# Patient Record
Sex: Female | Born: 1935 | Race: White | Hispanic: No | Marital: Married | Smoking: Former smoker
Health system: Southern US, Community
[De-identification: ages and names within clinical notes are randomized; demographics above are authoritative.]

## PROBLEM LIST (undated history)

## (undated) DIAGNOSIS — C719 Malignant neoplasm of brain, unspecified: Secondary | ICD-10-CM

## (undated) DIAGNOSIS — Z923 Personal history of irradiation: Secondary | ICD-10-CM

## (undated) DIAGNOSIS — N189 Chronic kidney disease, unspecified: Secondary | ICD-10-CM

## (undated) DIAGNOSIS — C569 Malignant neoplasm of unspecified ovary: Secondary | ICD-10-CM

## (undated) DIAGNOSIS — I10 Essential (primary) hypertension: Secondary | ICD-10-CM

## (undated) DIAGNOSIS — D649 Anemia, unspecified: Secondary | ICD-10-CM

## (undated) DIAGNOSIS — D249 Benign neoplasm of unspecified breast: Secondary | ICD-10-CM

## (undated) DIAGNOSIS — C50919 Malignant neoplasm of unspecified site of unspecified female breast: Secondary | ICD-10-CM

## (undated) DIAGNOSIS — N952 Postmenopausal atrophic vaginitis: Secondary | ICD-10-CM

## (undated) DIAGNOSIS — R5383 Other fatigue: Secondary | ICD-10-CM

## (undated) HISTORY — DX: Malignant neoplasm of unspecified ovary: C56.9

## (undated) HISTORY — DX: Benign neoplasm of unspecified breast: D24.9

## (undated) HISTORY — DX: Essential (primary) hypertension: I10

## (undated) HISTORY — DX: Other fatigue: R53.83

## (undated) HISTORY — DX: Anemia, unspecified: D64.9

## (undated) HISTORY — PX: EYE SURGERY: SHX253

## (undated) HISTORY — DX: Malignant neoplasm of brain, unspecified: C71.9

## (undated) HISTORY — PX: APPENDECTOMY: SHX54

## (undated) HISTORY — DX: Chronic kidney disease, unspecified: N18.9

## (undated) HISTORY — DX: Postmenopausal atrophic vaginitis: N95.2

## (undated) HISTORY — DX: Malignant neoplasm of unspecified site of unspecified female breast: C50.919

---

## 1989-02-17 DIAGNOSIS — D249 Benign neoplasm of unspecified breast: Secondary | ICD-10-CM

## 1989-02-17 HISTORY — DX: Benign neoplasm of unspecified breast: D24.9

## 1998-05-08 ENCOUNTER — Other Ambulatory Visit: Admission: RE | Admit: 1998-05-08 | Discharge: 1998-05-08 | Payer: Self-pay | Admitting: Obstetrics and Gynecology

## 1999-06-29 ENCOUNTER — Other Ambulatory Visit: Admission: RE | Admit: 1999-06-29 | Discharge: 1999-06-29 | Payer: Self-pay | Admitting: Obstetrics and Gynecology

## 2000-07-11 ENCOUNTER — Other Ambulatory Visit: Admission: RE | Admit: 2000-07-11 | Discharge: 2000-07-11 | Payer: Self-pay | Admitting: Obstetrics and Gynecology

## 2001-07-11 ENCOUNTER — Other Ambulatory Visit: Admission: RE | Admit: 2001-07-11 | Discharge: 2001-07-11 | Payer: Self-pay | Admitting: Obstetrics and Gynecology

## 2002-07-10 ENCOUNTER — Encounter: Payer: Self-pay | Admitting: Gastroenterology

## 2002-09-20 ENCOUNTER — Ambulatory Visit (HOSPITAL_COMMUNITY): Admission: RE | Admit: 2002-09-20 | Discharge: 2002-09-20 | Payer: Self-pay | Admitting: Obstetrics and Gynecology

## 2002-09-20 ENCOUNTER — Encounter: Payer: Self-pay | Admitting: Obstetrics and Gynecology

## 2002-10-03 ENCOUNTER — Ambulatory Visit: Admission: RE | Admit: 2002-10-03 | Discharge: 2002-10-03 | Payer: Self-pay | Admitting: Gynecology

## 2002-10-04 DIAGNOSIS — C569 Malignant neoplasm of unspecified ovary: Secondary | ICD-10-CM

## 2002-10-04 HISTORY — PX: TOTAL ABDOMINAL HYSTERECTOMY W/ BILATERAL SALPINGOOPHORECTOMY: SHX83

## 2002-10-04 HISTORY — DX: Malignant neoplasm of unspecified ovary: C56.9

## 2002-10-05 ENCOUNTER — Encounter: Payer: Self-pay | Admitting: Gynecology

## 2002-10-09 ENCOUNTER — Encounter (INDEPENDENT_AMBULATORY_CARE_PROVIDER_SITE_OTHER): Payer: Self-pay | Admitting: Specialist

## 2002-10-09 ENCOUNTER — Inpatient Hospital Stay (HOSPITAL_COMMUNITY): Admission: RE | Admit: 2002-10-09 | Discharge: 2002-10-12 | Payer: Self-pay | Admitting: Gynecology

## 2002-10-17 ENCOUNTER — Ambulatory Visit: Admission: RE | Admit: 2002-10-17 | Discharge: 2002-10-17 | Payer: Self-pay | Admitting: Gynecology

## 2002-11-28 ENCOUNTER — Ambulatory Visit: Admission: RE | Admit: 2002-11-28 | Discharge: 2002-11-28 | Payer: Self-pay | Admitting: Gynecology

## 2003-04-24 ENCOUNTER — Ambulatory Visit: Admission: RE | Admit: 2003-04-24 | Discharge: 2003-04-24 | Payer: Self-pay | Admitting: Gynecology

## 2003-10-16 ENCOUNTER — Ambulatory Visit: Admission: RE | Admit: 2003-10-16 | Discharge: 2003-10-16 | Payer: Self-pay | Admitting: Gynecology

## 2004-01-30 ENCOUNTER — Ambulatory Visit (HOSPITAL_COMMUNITY): Admission: RE | Admit: 2004-01-30 | Discharge: 2004-01-30 | Payer: Self-pay | Admitting: Internal Medicine

## 2004-04-08 ENCOUNTER — Ambulatory Visit (HOSPITAL_COMMUNITY): Admission: RE | Admit: 2004-04-08 | Discharge: 2004-04-08 | Payer: Self-pay | Admitting: Vascular Surgery

## 2004-04-14 ENCOUNTER — Ambulatory Visit: Admission: RE | Admit: 2004-04-14 | Discharge: 2004-04-14 | Payer: Self-pay | Admitting: Gynecology

## 2004-08-06 ENCOUNTER — Ambulatory Visit: Payer: Self-pay | Admitting: Hematology & Oncology

## 2004-08-18 ENCOUNTER — Encounter (HOSPITAL_COMMUNITY): Admission: RE | Admit: 2004-08-18 | Discharge: 2004-08-31 | Payer: Self-pay | Admitting: Nephrology

## 2004-10-09 ENCOUNTER — Ambulatory Visit: Payer: Self-pay | Admitting: Hematology & Oncology

## 2004-10-20 ENCOUNTER — Ambulatory Visit: Admission: RE | Admit: 2004-10-20 | Discharge: 2004-10-20 | Payer: Self-pay | Admitting: Gynecology

## 2004-12-10 ENCOUNTER — Ambulatory Visit: Payer: Self-pay | Admitting: Hematology & Oncology

## 2005-02-10 ENCOUNTER — Ambulatory Visit: Payer: Self-pay | Admitting: Hematology & Oncology

## 2005-04-15 ENCOUNTER — Ambulatory Visit: Payer: Self-pay | Admitting: Hematology & Oncology

## 2005-06-16 ENCOUNTER — Ambulatory Visit: Payer: Self-pay | Admitting: Hematology & Oncology

## 2005-08-16 ENCOUNTER — Ambulatory Visit (HOSPITAL_COMMUNITY): Admission: RE | Admit: 2005-08-16 | Discharge: 2005-08-16 | Payer: Self-pay | Admitting: Nephrology

## 2005-08-17 ENCOUNTER — Ambulatory Visit: Payer: Self-pay | Admitting: Hematology & Oncology

## 2005-10-13 ENCOUNTER — Ambulatory Visit: Payer: Self-pay | Admitting: Hematology & Oncology

## 2005-11-02 ENCOUNTER — Ambulatory Visit: Admission: RE | Admit: 2005-11-02 | Discharge: 2005-11-02 | Payer: Self-pay | Admitting: Gynecology

## 2005-12-08 ENCOUNTER — Ambulatory Visit: Payer: Self-pay | Admitting: Hematology & Oncology

## 2006-01-06 LAB — CBC WITH DIFFERENTIAL/PLATELET
BASO%: 0.3 % (ref 0.0–2.0)
Basophils Absolute: 0 10*3/uL (ref 0.0–0.1)
EOS%: 4.2 % (ref 0.0–7.0)
HCT: 37.7 % (ref 34.8–46.6)
LYMPH%: 25 % (ref 14.0–48.0)
MCH: 32.4 pg (ref 26.0–34.0)
MCHC: 34 g/dL (ref 32.0–36.0)
NEUT%: 66.3 % (ref 39.6–76.8)
Platelets: 246 10*3/uL (ref 145–400)

## 2006-01-06 LAB — COMPREHENSIVE METABOLIC PANEL
ALT: 26 U/L (ref 0–40)
AST: 26 U/L (ref 0–37)
Albumin: 3.7 g/dL (ref 3.5–5.2)
Alkaline Phosphatase: 92 U/L (ref 39–117)
Potassium: 4.3 mEq/L (ref 3.5–5.3)
Sodium: 138 mEq/L (ref 135–145)
Total Protein: 6.8 g/dL (ref 6.0–8.3)

## 2006-02-01 ENCOUNTER — Ambulatory Visit: Payer: Self-pay | Admitting: Hematology & Oncology

## 2006-02-03 LAB — COMPREHENSIVE METABOLIC PANEL
ALT: 66 U/L — ABNORMAL HIGH (ref 0–40)
Albumin: 4 g/dL (ref 3.5–5.2)
CO2: 25 mEq/L (ref 19–32)
Calcium: 9.7 mg/dL (ref 8.4–10.5)
Chloride: 102 mEq/L (ref 96–112)
Creatinine, Ser: 2.9 mg/dL — ABNORMAL HIGH (ref 0.4–1.2)
Potassium: 4.1 mEq/L (ref 3.5–5.3)

## 2006-02-03 LAB — CBC WITH DIFFERENTIAL/PLATELET
Basophils Absolute: 0 10*3/uL (ref 0.0–0.1)
Eosinophils Absolute: 0.3 10*3/uL (ref 0.0–0.5)
HCT: 33.2 % — ABNORMAL LOW (ref 34.8–46.6)
HGB: 11.4 g/dL — ABNORMAL LOW (ref 11.6–15.9)
LYMPH%: 18.3 % (ref 14.0–48.0)
MCV: 94.7 fL (ref 81.0–101.0)
MONO%: 5 % (ref 0.0–13.0)
NEUT#: 3.9 10*3/uL (ref 1.5–6.5)
NEUT%: 71.7 % (ref 39.6–76.8)
Platelets: 230 10*3/uL (ref 145–400)
RBC: 3.51 10*6/uL — ABNORMAL LOW (ref 3.70–5.32)

## 2006-02-03 LAB — MAGNESIUM: Magnesium: 1.6 mg/dL (ref 1.5–2.5)

## 2006-02-04 LAB — PTH, INTACT AND CALCIUM: Calcium, Total (PTH): 9.6 mg/dL (ref 8.4–10.5)

## 2006-03-03 LAB — CBC & DIFF AND RETIC
BASO%: 0.3 % (ref 0.0–2.0)
Basophils Absolute: 0 10*3/uL (ref 0.0–0.1)
EOS%: 3.6 % (ref 0.0–7.0)
HCT: 37.7 % (ref 34.8–46.6)
LYMPH%: 23.6 % (ref 14.0–48.0)
MCH: 32.6 pg (ref 26.0–34.0)
MCHC: 34.3 g/dL (ref 32.0–36.0)
MCV: 95 fL (ref 81.0–101.0)
MONO%: 3.5 % (ref 0.0–13.0)
NEUT%: 69 % (ref 39.6–76.8)
Platelets: 253 10*3/uL (ref 145–400)
lymph#: 1.3 10*3/uL (ref 0.9–3.3)

## 2006-03-03 LAB — FERRITIN: Ferritin: 289 ng/mL (ref 10–291)

## 2006-03-28 ENCOUNTER — Ambulatory Visit: Payer: Self-pay | Admitting: Hematology & Oncology

## 2006-03-31 LAB — CBC WITH DIFFERENTIAL/PLATELET
BASO%: 0.3 % (ref 0.0–2.0)
EOS%: 3.6 % (ref 0.0–7.0)
LYMPH%: 18 % (ref 14.0–48.0)
MCHC: 33.9 g/dL (ref 32.0–36.0)
MONO#: 0.3 10*3/uL (ref 0.1–0.9)
RBC: 3.69 10*6/uL — ABNORMAL LOW (ref 3.70–5.32)
WBC: 6.2 10*3/uL (ref 3.9–10.0)
lymph#: 1.1 10*3/uL (ref 0.9–3.3)

## 2006-03-31 LAB — COMPREHENSIVE METABOLIC PANEL
ALT: 20 U/L (ref 0–40)
AST: 22 U/L (ref 0–37)
Alkaline Phosphatase: 80 U/L (ref 39–117)
Calcium: 9.8 mg/dL (ref 8.4–10.5)
Chloride: 99 mEq/L (ref 96–112)
Creatinine, Ser: 2.88 mg/dL — ABNORMAL HIGH (ref 0.40–1.20)

## 2006-03-31 LAB — FERRITIN: Ferritin: 380 ng/mL — ABNORMAL HIGH (ref 10–291)

## 2006-04-13 LAB — CA 125: CA 125: 21.9 U/mL (ref 0.0–30.2)

## 2006-04-28 LAB — CBC WITH DIFFERENTIAL/PLATELET
BASO%: 0.3 % (ref 0.0–2.0)
Basophils Absolute: 0 10*3/uL (ref 0.0–0.1)
EOS%: 2.8 % (ref 0.0–7.0)
HCT: 37.9 % (ref 34.8–46.6)
HGB: 12.7 g/dL (ref 11.6–15.9)
LYMPH%: 21.3 % (ref 14.0–48.0)
MCH: 32.2 pg (ref 26.0–34.0)
MCHC: 33.6 g/dL (ref 32.0–36.0)
MCV: 96 fL (ref 81.0–101.0)
MONO%: 6.9 % (ref 0.0–13.0)
NEUT%: 68.7 % (ref 39.6–76.8)
lymph#: 1.1 10*3/uL (ref 0.9–3.3)

## 2006-05-20 ENCOUNTER — Ambulatory Visit: Payer: Self-pay | Admitting: Hematology & Oncology

## 2006-05-26 LAB — CBC WITH DIFFERENTIAL/PLATELET
Basophils Absolute: 0 10*3/uL (ref 0.0–0.1)
Eosinophils Absolute: 0.2 10*3/uL (ref 0.0–0.5)
HCT: 34.9 % (ref 34.8–46.6)
HGB: 12 g/dL (ref 11.6–15.9)
LYMPH%: 21.5 % (ref 14.0–48.0)
MCV: 94.8 fL (ref 81.0–101.0)
MONO#: 0.5 10*3/uL (ref 0.1–0.9)
MONO%: 7.7 % (ref 0.0–13.0)
NEUT#: 4 10*3/uL (ref 1.5–6.5)
Platelets: 211 10*3/uL (ref 145–400)

## 2006-05-27 LAB — COMPREHENSIVE METABOLIC PANEL
AST: 29 U/L (ref 0–37)
BUN: 71 mg/dL — ABNORMAL HIGH (ref 6–23)
Calcium: 9.8 mg/dL (ref 8.4–10.5)
Chloride: 102 mEq/L (ref 96–112)
Creatinine, Ser: 2.69 mg/dL — ABNORMAL HIGH (ref 0.40–1.20)

## 2006-05-27 LAB — IRON AND TIBC
%SAT: 32 % (ref 20–55)
Iron: 90 ug/dL (ref 42–145)

## 2006-05-27 LAB — PTH, INTACT AND CALCIUM: Calcium, Total (PTH): 9.8 mg/dL (ref 8.4–10.5)

## 2006-06-23 LAB — CBC WITH DIFFERENTIAL/PLATELET
BASO%: 0.3 % (ref 0.0–2.0)
Eosinophils Absolute: 0.3 10*3/uL (ref 0.0–0.5)
HCT: 38.9 % (ref 34.8–46.6)
LYMPH%: 21.9 % (ref 14.0–48.0)
MCHC: 33.9 g/dL (ref 32.0–36.0)
MCV: 95.9 fL (ref 81.0–101.0)
MONO%: 7.2 % (ref 0.0–13.0)
NEUT%: 65.2 % (ref 39.6–76.8)
Platelets: 274 10*3/uL (ref 145–400)
RBC: 4.06 10*6/uL (ref 3.70–5.32)

## 2006-07-19 ENCOUNTER — Ambulatory Visit: Payer: Self-pay | Admitting: Hematology & Oncology

## 2006-08-18 LAB — CBC WITH DIFFERENTIAL/PLATELET
Basophils Absolute: 0 10*3/uL (ref 0.0–0.1)
EOS%: 5.3 % (ref 0.0–7.0)
HCT: 37.1 % (ref 34.8–46.6)
HGB: 12.5 g/dL (ref 11.6–15.9)
MCH: 32.2 pg (ref 26.0–34.0)
MCV: 95.3 fL (ref 81.0–101.0)
MONO%: 5.1 % (ref 0.0–13.0)
NEUT%: 71.3 % (ref 39.6–76.8)
Platelets: 254 10*3/uL (ref 145–400)

## 2006-09-08 ENCOUNTER — Ambulatory Visit: Payer: Self-pay | Admitting: Hematology & Oncology

## 2006-09-12 LAB — CBC WITH DIFFERENTIAL/PLATELET
BASO%: 0.3 % (ref 0.0–2.0)
Basophils Absolute: 0 10*3/uL (ref 0.0–0.1)
EOS%: 4.3 % (ref 0.0–7.0)
HCT: 34.4 % — ABNORMAL LOW (ref 34.8–46.6)
HGB: 11.7 g/dL (ref 11.6–15.9)
MCH: 32 pg (ref 26.0–34.0)
MONO#: 0.3 10*3/uL (ref 0.1–0.9)
RDW: 15.8 % — ABNORMAL HIGH (ref 11.3–14.5)
WBC: 5.1 10*3/uL (ref 3.9–10.0)
lymph#: 0.7 10*3/uL — ABNORMAL LOW (ref 0.9–3.3)

## 2006-09-13 LAB — RENAL FUNCTION PANEL
BUN: 95 mg/dL — ABNORMAL HIGH (ref 6–23)
CO2: 23 mEq/L (ref 19–32)
Chloride: 103 mEq/L (ref 96–112)
Phosphorus: 4.3 mg/dL (ref 2.3–4.6)
Potassium: 4 mEq/L (ref 3.5–5.3)

## 2006-09-13 LAB — PTH, INTACT AND CALCIUM: PTH: 96.5 pg/mL — ABNORMAL HIGH (ref 14.0–72.0)

## 2006-10-13 LAB — CBC WITH DIFFERENTIAL/PLATELET
BASO%: 0.1 % (ref 0.0–2.0)
EOS%: 3.9 % (ref 0.0–7.0)
HCT: 38 % (ref 34.8–46.6)
MCH: 32 pg (ref 26.0–34.0)
MCHC: 33.7 g/dL (ref 32.0–36.0)
MCV: 94.9 fL (ref 81.0–101.0)
MONO%: 5.9 % (ref 0.0–13.0)
NEUT%: 73.8 % (ref 39.6–76.8)
RDW: 15.9 % — ABNORMAL HIGH (ref 11.3–14.5)
lymph#: 0.8 10*3/uL — ABNORMAL LOW (ref 0.9–3.3)

## 2006-10-19 ENCOUNTER — Ambulatory Visit: Admission: RE | Admit: 2006-10-19 | Discharge: 2006-10-19 | Payer: Self-pay | Admitting: Gynecology

## 2006-11-08 ENCOUNTER — Ambulatory Visit: Payer: Self-pay | Admitting: Hematology & Oncology

## 2006-11-10 LAB — CBC WITH DIFFERENTIAL/PLATELET
Basophils Absolute: 0 10*3/uL (ref 0.0–0.1)
Eosinophils Absolute: 0.3 10*3/uL (ref 0.0–0.5)
HCT: 33.9 % — ABNORMAL LOW (ref 34.8–46.6)
HGB: 11.9 g/dL (ref 11.6–15.9)
MCV: 93 fL (ref 81.0–101.0)
MONO%: 5.8 % (ref 0.0–13.0)
NEUT#: 4.4 10*3/uL (ref 1.5–6.5)
NEUT%: 75.4 % (ref 39.6–76.8)
Platelets: 220 10*3/uL (ref 145–400)
RDW: 15.9 % — ABNORMAL HIGH (ref 11.3–14.5)

## 2006-12-08 LAB — CBC WITH DIFFERENTIAL/PLATELET
Basophils Absolute: 0 10*3/uL (ref 0.0–0.1)
Eosinophils Absolute: 0.2 10*3/uL (ref 0.0–0.5)
LYMPH%: 17.2 % (ref 14.0–48.0)
MCH: 32.5 pg (ref 26.0–34.0)
MCV: 94.4 fL (ref 81.0–101.0)
MONO%: 5.5 % (ref 0.0–13.0)
NEUT#: 3.5 10*3/uL (ref 1.5–6.5)
Platelets: 221 10*3/uL (ref 145–400)
RBC: 4.03 10*6/uL (ref 3.70–5.32)

## 2007-01-02 ENCOUNTER — Ambulatory Visit: Payer: Self-pay | Admitting: Hematology & Oncology

## 2007-01-05 LAB — CBC WITH DIFFERENTIAL/PLATELET
BASO%: 0.1 % (ref 0.0–2.0)
LYMPH%: 16.9 % (ref 14.0–48.0)
MCHC: 35.4 g/dL (ref 32.0–36.0)
MCV: 93.4 fL (ref 81.0–101.0)
MONO%: 6.9 % (ref 0.0–13.0)
Platelets: 219 10*3/uL (ref 145–400)
RBC: 3.65 10*6/uL — ABNORMAL LOW (ref 3.70–5.32)
WBC: 5.6 10*3/uL (ref 3.9–10.0)

## 2007-02-13 LAB — CBC WITH DIFFERENTIAL/PLATELET
BASO%: 0.2 % (ref 0.0–2.0)
LYMPH%: 18.1 % (ref 14.0–48.0)
MCHC: 34.6 g/dL (ref 32.0–36.0)
MONO#: 0.4 10*3/uL (ref 0.1–0.9)
RBC: 3.81 10*6/uL (ref 3.70–5.32)
WBC: 6.3 10*3/uL (ref 3.9–10.0)
lymph#: 1.1 10*3/uL (ref 0.9–3.3)

## 2007-02-28 ENCOUNTER — Ambulatory Visit: Payer: Self-pay | Admitting: Hematology & Oncology

## 2007-03-02 LAB — CBC WITH DIFFERENTIAL/PLATELET
BASO%: 0.3 % (ref 0.0–2.0)
Basophils Absolute: 0 10*3/uL (ref 0.0–0.1)
EOS%: 3.4 % (ref 0.0–7.0)
HCT: 35.2 % (ref 34.8–46.6)
HGB: 12.3 g/dL (ref 11.6–15.9)
LYMPH%: 19.3 % (ref 14.0–48.0)
MCH: 32.8 pg (ref 26.0–34.0)
MCHC: 34.9 g/dL (ref 32.0–36.0)
MCV: 94.2 fL (ref 81.0–101.0)
MONO#: 0.4 10*3/uL (ref 0.1–0.9)
MONO%: 6.3 % (ref 0.0–13.0)
NEUT%: 70.7 % (ref 39.6–76.8)
Platelets: 234 10*3/uL (ref 145–400)
lymph#: 1.2 10*3/uL (ref 0.9–3.3)

## 2007-03-22 LAB — CBC WITH DIFFERENTIAL/PLATELET
BASO%: 0.7 % (ref 0.0–2.0)
Basophils Absolute: 0 10*3/uL (ref 0.0–0.1)
Eosinophils Absolute: 0.2 10*3/uL (ref 0.0–0.5)
HGB: 13 g/dL (ref 11.6–15.9)
LYMPH%: 17.4 % (ref 14.0–48.0)
MCH: 32.2 pg (ref 26.0–34.0)
MCV: 95.2 fL (ref 81.0–101.0)
NEUT#: 3.2 10*3/uL (ref 1.5–6.5)
NEUT%: 73.2 % (ref 39.6–76.8)
Platelets: 264 10*3/uL (ref 145–400)

## 2007-03-22 LAB — BASIC METABOLIC PANEL
BUN: 70 mg/dL — ABNORMAL HIGH (ref 6–23)
CO2: 23 mEq/L (ref 19–32)
Calcium: 9.4 mg/dL (ref 8.4–10.5)
Chloride: 106 mEq/L (ref 96–112)
Creatinine, Ser: 2.79 mg/dL — ABNORMAL HIGH (ref 0.40–1.20)

## 2007-03-22 LAB — IRON AND TIBC: TIBC: 229 ug/dL — ABNORMAL LOW (ref 250–470)

## 2007-04-13 ENCOUNTER — Ambulatory Visit: Payer: Self-pay | Admitting: Hematology & Oncology

## 2007-04-27 LAB — CBC WITH DIFFERENTIAL/PLATELET
Basophils Absolute: 0 10*3/uL (ref 0.0–0.1)
HCT: 33.7 % — ABNORMAL LOW (ref 34.8–46.6)
HGB: 11.8 g/dL (ref 11.6–15.9)
MONO#: 0.2 10*3/uL (ref 0.1–0.9)
NEUT#: 4.1 10*3/uL (ref 1.5–6.5)
NEUT%: 74.4 % (ref 39.6–76.8)
WBC: 5.5 10*3/uL (ref 3.9–10.0)
lymph#: 0.9 10*3/uL (ref 0.9–3.3)

## 2007-05-25 LAB — CBC WITH DIFFERENTIAL/PLATELET
Basophils Absolute: 0 10*3/uL (ref 0.0–0.1)
EOS%: 4.7 % (ref 0.0–7.0)
Eosinophils Absolute: 0.2 10*3/uL (ref 0.0–0.5)
HCT: 38 % (ref 34.8–46.6)
HGB: 13.2 g/dL (ref 11.6–15.9)
MCH: 32.8 pg (ref 26.0–34.0)
MCV: 94.7 fL (ref 81.0–101.0)
MONO%: 3.8 % (ref 0.0–13.0)
NEUT#: 3.2 10*3/uL (ref 1.5–6.5)
NEUT%: 70.6 % (ref 39.6–76.8)
lymph#: 0.9 10*3/uL (ref 0.9–3.3)

## 2007-06-19 ENCOUNTER — Ambulatory Visit: Payer: Self-pay | Admitting: Hematology & Oncology

## 2007-06-21 LAB — CBC WITH DIFFERENTIAL/PLATELET
Basophils Absolute: 0 10*3/uL (ref 0.0–0.1)
Eosinophils Absolute: 0.3 10*3/uL (ref 0.0–0.5)
HGB: 12 g/dL (ref 11.6–15.9)
MCV: 93.7 fL (ref 81.0–101.0)
MONO#: 0.3 10*3/uL (ref 0.1–0.9)
NEUT#: 4 10*3/uL (ref 1.5–6.5)
RBC: 3.62 10*6/uL — ABNORMAL LOW (ref 3.70–5.32)
RDW: 16.1 % — ABNORMAL HIGH (ref 11.3–14.5)
WBC: 5.5 10*3/uL (ref 3.9–10.0)

## 2007-07-20 LAB — CBC WITH DIFFERENTIAL/PLATELET
Basophils Absolute: 0 10*3/uL (ref 0.0–0.1)
Eosinophils Absolute: 0.3 10*3/uL (ref 0.0–0.5)
HCT: 37.2 % (ref 34.8–46.6)
HGB: 12.4 g/dL (ref 11.6–15.9)
LYMPH%: 16.1 % (ref 14.0–48.0)
MCV: 95.8 fL (ref 81.0–101.0)
MONO#: 0.4 10*3/uL (ref 0.1–0.9)
MONO%: 7.2 % (ref 0.0–13.0)
NEUT#: 3.9 10*3/uL (ref 1.5–6.5)
NEUT%: 70 % (ref 39.6–76.8)
Platelets: 207 10*3/uL (ref 145–400)
RBC: 3.88 10*6/uL (ref 3.70–5.32)
WBC: 5.6 10*3/uL (ref 3.9–10.0)

## 2007-08-15 ENCOUNTER — Ambulatory Visit: Payer: Self-pay | Admitting: Hematology & Oncology

## 2007-08-17 LAB — CBC WITH DIFFERENTIAL/PLATELET
BASO%: 0.5 % (ref 0.0–2.0)
HCT: 33.4 % — ABNORMAL LOW (ref 34.8–46.6)
LYMPH%: 17.5 % (ref 14.0–48.0)
MCHC: 34.3 g/dL (ref 32.0–36.0)
MCV: 94.6 fL (ref 81.0–101.0)
MONO#: 0.2 10*3/uL (ref 0.1–0.9)
MONO%: 4.3 % (ref 0.0–13.0)
NEUT%: 72.6 % (ref 39.6–76.8)
Platelets: 211 10*3/uL (ref 145–400)
WBC: 5.7 10*3/uL (ref 3.9–10.0)

## 2007-09-14 LAB — CBC WITH DIFFERENTIAL/PLATELET
BASO%: 0.4 % (ref 0.0–2.0)
EOS%: 4.8 % (ref 0.0–7.0)
HCT: 37.6 % (ref 34.8–46.6)
LYMPH%: 17 % (ref 14.0–48.0)
MCH: 32 pg (ref 26.0–34.0)
MCHC: 33.7 g/dL (ref 32.0–36.0)
NEUT%: 72.4 % (ref 39.6–76.8)
Platelets: 222 10*3/uL (ref 145–400)
RBC: 3.97 10*6/uL (ref 3.70–5.32)
lymph#: 0.8 10*3/uL — ABNORMAL LOW (ref 0.9–3.3)

## 2007-10-10 ENCOUNTER — Ambulatory Visit: Payer: Self-pay | Admitting: Hematology & Oncology

## 2007-10-12 LAB — CBC WITH DIFFERENTIAL/PLATELET
BASO%: 0.2 % (ref 0.0–2.0)
EOS%: 5.8 % (ref 0.0–7.0)
MCH: 32.3 pg (ref 26.0–34.0)
MCHC: 34.5 g/dL (ref 32.0–36.0)
RDW: 15.9 % — ABNORMAL HIGH (ref 11.3–14.5)
lymph#: 1 10*3/uL (ref 0.9–3.3)

## 2007-11-09 LAB — CBC WITH DIFFERENTIAL/PLATELET
BASO%: 0.2 % (ref 0.0–2.0)
Eosinophils Absolute: 0.3 10*3/uL (ref 0.0–0.5)
LYMPH%: 16.9 % (ref 14.0–48.0)
MCHC: 32.2 g/dL (ref 32.0–36.0)
MONO#: 0.1 10*3/uL (ref 0.1–0.9)
NEUT#: 3.9 10*3/uL (ref 1.5–6.5)
RBC: 4.3 10*6/uL (ref 3.70–5.32)
RDW: 16.5 % — ABNORMAL HIGH (ref 11.3–14.5)
WBC: 5.2 10*3/uL (ref 3.9–10.0)
lymph#: 0.9 10*3/uL (ref 0.9–3.3)

## 2007-11-09 LAB — CA 125: CA 125: 19.9 U/mL (ref 0.0–30.2)

## 2007-11-14 ENCOUNTER — Ambulatory Visit: Admission: RE | Admit: 2007-11-14 | Discharge: 2007-11-14 | Payer: Self-pay | Admitting: Gynecology

## 2007-12-05 ENCOUNTER — Ambulatory Visit: Payer: Self-pay | Admitting: Hematology & Oncology

## 2007-12-07 LAB — CBC WITH DIFFERENTIAL/PLATELET
Basophils Absolute: 0 10*3/uL (ref 0.0–0.1)
Eosinophils Absolute: 0.3 10*3/uL (ref 0.0–0.5)
HGB: 11.8 g/dL (ref 11.6–15.9)
MCV: 94.1 fL (ref 81.0–101.0)
MONO#: 0.2 10*3/uL (ref 0.1–0.9)
MONO%: 4.1 % (ref 0.0–13.0)
NEUT#: 4 10*3/uL (ref 1.5–6.5)
Platelets: 214 10*3/uL (ref 145–400)
RBC: 3.53 10*6/uL — ABNORMAL LOW (ref 3.70–5.32)
RDW: 16.1 % — ABNORMAL HIGH (ref 11.3–14.5)
WBC: 5.5 10*3/uL (ref 3.9–10.0)

## 2008-01-29 ENCOUNTER — Ambulatory Visit: Payer: Self-pay | Admitting: Hematology & Oncology

## 2008-01-31 LAB — CBC WITH DIFFERENTIAL/PLATELET
Basophils Absolute: 0 10*3/uL (ref 0.0–0.1)
EOS%: 6.3 % (ref 0.0–7.0)
Eosinophils Absolute: 0.4 10*3/uL (ref 0.0–0.5)
HGB: 12.4 g/dL (ref 11.6–15.9)
MONO%: 5 % (ref 0.0–13.0)
NEUT#: 4.3 10*3/uL (ref 1.5–6.5)
RBC: 3.8 10*6/uL (ref 3.70–5.32)
RDW: 15.7 % — ABNORMAL HIGH (ref 11.3–14.5)
lymph#: 1 10*3/uL (ref 0.9–3.3)

## 2008-02-29 LAB — CBC WITH DIFFERENTIAL/PLATELET
BASO%: 0 % (ref 0.0–2.0)
EOS%: 4.6 % (ref 0.0–7.0)
MCH: 32.5 pg (ref 26.0–34.0)
MCHC: 34.5 g/dL (ref 32.0–36.0)
MCV: 94.1 fL (ref 81.0–101.0)
MONO%: 4.9 % (ref 0.0–13.0)
RBC: 3.72 10*6/uL (ref 3.70–5.32)
RDW: 15.5 % — ABNORMAL HIGH (ref 11.3–14.5)
lymph#: 1 10*3/uL (ref 0.9–3.3)

## 2008-04-02 ENCOUNTER — Ambulatory Visit: Payer: Self-pay | Admitting: Hematology & Oncology

## 2008-04-04 LAB — CBC WITH DIFFERENTIAL/PLATELET
EOS%: 5.1 % (ref 0.0–7.0)
Eosinophils Absolute: 0.3 10*3/uL (ref 0.0–0.5)
MCH: 32.7 pg (ref 26.0–34.0)
MCV: 95.2 fL (ref 81.0–101.0)
MONO%: 6.7 % (ref 0.0–13.0)
NEUT#: 4.2 10*3/uL (ref 1.5–6.5)
RBC: 3.45 10*6/uL — ABNORMAL LOW (ref 3.70–5.32)
RDW: 14.7 % — ABNORMAL HIGH (ref 11.3–14.5)
lymph#: 1.1 10*3/uL (ref 0.9–3.3)

## 2008-04-04 LAB — COMPREHENSIVE METABOLIC PANEL
Albumin: 4 g/dL (ref 3.5–5.2)
CO2: 20 mEq/L (ref 19–32)
Calcium: 9.5 mg/dL (ref 8.4–10.5)
Chloride: 104 mEq/L (ref 96–112)
Glucose, Bld: 103 mg/dL — ABNORMAL HIGH (ref 70–99)
Potassium: 5.2 mEq/L (ref 3.5–5.3)
Sodium: 136 mEq/L (ref 135–145)
Total Bilirubin: 0.3 mg/dL (ref 0.3–1.2)
Total Protein: 7.1 g/dL (ref 6.0–8.3)

## 2008-04-04 LAB — CA 125: CA 125: 24.6 U/mL (ref 0.0–30.2)

## 2008-04-04 LAB — PHOSPHORUS: Phosphorus: 3.5 mg/dL (ref 2.3–4.6)

## 2008-05-28 ENCOUNTER — Ambulatory Visit: Payer: Self-pay | Admitting: Hematology & Oncology

## 2008-05-30 LAB — CBC WITH DIFFERENTIAL/PLATELET
BASO%: 0.4 % (ref 0.0–2.0)
EOS%: 4.3 % (ref 0.0–7.0)
HCT: 32.6 % — ABNORMAL LOW (ref 34.8–46.6)
MCH: 33 pg (ref 26.0–34.0)
MCHC: 34.2 g/dL (ref 32.0–36.0)
NEUT%: 71.1 % (ref 39.6–76.8)
RBC: 3.37 10*6/uL — ABNORMAL LOW (ref 3.70–5.32)
WBC: 5.5 10*3/uL (ref 3.9–10.0)
lymph#: 1 10*3/uL (ref 0.9–3.3)

## 2008-05-30 LAB — FERRITIN: Ferritin: 558 ng/mL — ABNORMAL HIGH (ref 10–291)

## 2008-07-04 LAB — CBC WITH DIFFERENTIAL/PLATELET
BASO%: 0.4 % (ref 0.0–2.0)
EOS%: 5.8 % (ref 0.0–7.0)
Eosinophils Absolute: 0.3 10*3/uL (ref 0.0–0.5)
LYMPH%: 17.9 % (ref 14.0–48.0)
MCH: 32.1 pg (ref 26.0–34.0)
MCHC: 33.3 g/dL (ref 32.0–36.0)
MCV: 96.2 fL (ref 81.0–101.0)
MONO%: 5.6 % (ref 0.0–13.0)
Platelets: 188 10*3/uL (ref 145–400)
RBC: 3.37 10*6/uL — ABNORMAL LOW (ref 3.70–5.32)
RDW: 14.4 % (ref 11.3–14.5)

## 2008-07-04 LAB — FERRITIN: Ferritin: 613 ng/mL — ABNORMAL HIGH (ref 10–291)

## 2008-09-11 ENCOUNTER — Other Ambulatory Visit: Admission: RE | Admit: 2008-09-11 | Discharge: 2008-09-11 | Payer: Self-pay | Admitting: Obstetrics & Gynecology

## 2008-09-23 ENCOUNTER — Ambulatory Visit: Payer: Self-pay | Admitting: Hematology & Oncology

## 2008-09-25 LAB — CBC WITH DIFFERENTIAL/PLATELET
BASO%: 0.5 % (ref 0.0–2.0)
Basophils Absolute: 0 10*3/uL (ref 0.0–0.1)
EOS%: 4.8 % (ref 0.0–7.0)
HGB: 11.1 g/dL — ABNORMAL LOW (ref 11.6–15.9)
MCH: 33 pg (ref 26.0–34.0)
MCHC: 34.3 g/dL (ref 32.0–36.0)
MCV: 96.3 fL (ref 81.0–101.0)
MONO%: 3.5 % (ref 0.0–13.0)
RBC: 3.35 10*6/uL — ABNORMAL LOW (ref 3.70–5.32)
RDW: 15.4 % — ABNORMAL HIGH (ref 11.3–14.5)
lymph#: 0.8 10*3/uL — ABNORMAL LOW (ref 0.9–3.3)

## 2008-10-04 HISTORY — PX: SHOULDER ARTHROSCOPY: SHX128

## 2008-12-17 ENCOUNTER — Ambulatory Visit: Payer: Self-pay | Admitting: Hematology & Oncology

## 2008-12-19 LAB — CBC WITH DIFFERENTIAL/PLATELET
BASO%: 0.3 % (ref 0.0–2.0)
Basophils Absolute: 0 10*3/uL (ref 0.0–0.1)
EOS%: 4.4 % (ref 0.0–7.0)
Eosinophils Absolute: 0.2 10*3/uL (ref 0.0–0.5)
HCT: 32 % — ABNORMAL LOW (ref 34.8–46.6)
HGB: 10.9 g/dL — ABNORMAL LOW (ref 11.6–15.9)
LYMPH%: 16.3 % (ref 14.0–49.7)
MCH: 33.1 pg (ref 25.1–34.0)
MCHC: 34.1 g/dL (ref 31.5–36.0)
MCV: 97.1 fL (ref 79.5–101.0)
MONO#: 0.3 10*3/uL (ref 0.1–0.9)
MONO%: 5.2 % (ref 0.0–14.0)
NEUT#: 4.1 10*3/uL (ref 1.5–6.5)
NEUT%: 73.8 % (ref 38.4–76.8)
Platelets: 187 10*3/uL (ref 145–400)
RBC: 3.29 10*6/uL — ABNORMAL LOW (ref 3.70–5.45)
RDW: 15.3 % — ABNORMAL HIGH (ref 11.2–14.5)
WBC: 5.6 10*3/uL (ref 3.9–10.3)
lymph#: 0.9 10*3/uL (ref 0.9–3.3)

## 2008-12-19 LAB — FERRITIN: Ferritin: 605 ng/mL — ABNORMAL HIGH (ref 10–291)

## 2009-02-17 ENCOUNTER — Encounter: Admission: RE | Admit: 2009-02-17 | Discharge: 2009-02-17 | Payer: Self-pay | Admitting: Orthopedic Surgery

## 2009-02-20 ENCOUNTER — Ambulatory Visit (HOSPITAL_COMMUNITY): Admission: RE | Admit: 2009-02-20 | Discharge: 2009-02-21 | Payer: Self-pay | Admitting: Orthopedic Surgery

## 2009-03-06 ENCOUNTER — Ambulatory Visit: Payer: Self-pay | Admitting: Hematology & Oncology

## 2009-03-10 LAB — CBC WITH DIFFERENTIAL/PLATELET
Eosinophils Absolute: 0.3 10*3/uL (ref 0.0–0.5)
HCT: 31.4 % — ABNORMAL LOW (ref 34.8–46.6)
LYMPH%: 19.3 % (ref 14.0–49.7)
MCV: 98.1 fL (ref 79.5–101.0)
MONO#: 0.3 10*3/uL (ref 0.1–0.9)
MONO%: 4.2 % (ref 0.0–14.0)
NEUT#: 5 10*3/uL (ref 1.5–6.5)
NEUT%: 71.8 % (ref 38.4–76.8)
Platelets: 222 10*3/uL (ref 145–400)
RBC: 3.2 10*6/uL — ABNORMAL LOW (ref 3.70–5.45)
WBC: 6.9 10*3/uL (ref 3.9–10.3)

## 2009-03-20 ENCOUNTER — Ambulatory Visit: Payer: Self-pay | Admitting: Hematology & Oncology

## 2009-05-08 ENCOUNTER — Ambulatory Visit: Payer: Self-pay | Admitting: Hematology & Oncology

## 2009-05-12 LAB — CBC WITH DIFFERENTIAL/PLATELET
Basophils Absolute: 0 10*3/uL (ref 0.0–0.1)
Eosinophils Absolute: 0.3 10*3/uL (ref 0.0–0.5)
HGB: 11.9 g/dL (ref 11.6–15.9)
LYMPH%: 22 % (ref 14.0–49.7)
MCV: 95.4 fL (ref 79.5–101.0)
MONO#: 0.4 10*3/uL (ref 0.1–0.9)
MONO%: 6.7 % (ref 0.0–14.0)
NEUT#: 3.5 10*3/uL (ref 1.5–6.5)
Platelets: 193 10*3/uL (ref 145–400)
RBC: 3.63 10*6/uL — ABNORMAL LOW (ref 3.70–5.45)
RDW: 16.1 % — ABNORMAL HIGH (ref 11.2–14.5)
WBC: 5.4 10*3/uL (ref 3.9–10.3)

## 2009-07-07 ENCOUNTER — Ambulatory Visit: Payer: Self-pay | Admitting: Hematology & Oncology

## 2009-07-09 LAB — CBC WITH DIFFERENTIAL/PLATELET
BASO%: 0.2 % (ref 0.0–2.0)
EOS%: 5.2 % (ref 0.0–7.0)
MCH: 32.3 pg (ref 25.1–34.0)
MCHC: 34 g/dL (ref 31.5–36.0)
RBC: 3.51 10*6/uL — ABNORMAL LOW (ref 3.70–5.45)
RDW: 15.7 % — ABNORMAL HIGH (ref 11.2–14.5)
lymph#: 0.8 10*3/uL — ABNORMAL LOW (ref 0.9–3.3)

## 2009-07-15 ENCOUNTER — Encounter (INDEPENDENT_AMBULATORY_CARE_PROVIDER_SITE_OTHER): Payer: Self-pay | Admitting: *Deleted

## 2009-08-29 ENCOUNTER — Ambulatory Visit: Payer: Self-pay | Admitting: Hematology & Oncology

## 2009-09-01 LAB — CBC WITH DIFFERENTIAL/PLATELET
BASO%: 0.3 % (ref 0.0–2.0)
EOS%: 1.9 % (ref 0.0–7.0)
HCT: 35.6 % (ref 34.8–46.6)
LYMPH%: 17 % (ref 14.0–49.7)
MCH: 33 pg (ref 25.1–34.0)
MCHC: 33.7 g/dL (ref 31.5–36.0)
NEUT%: 70.1 % (ref 38.4–76.8)
Platelets: 172 10*3/uL (ref 145–400)

## 2009-10-06 ENCOUNTER — Ambulatory Visit: Payer: Self-pay | Admitting: Hematology & Oncology

## 2009-10-08 LAB — COMPREHENSIVE METABOLIC PANEL
ALT: 15 U/L (ref 0–35)
AST: 21 U/L (ref 0–37)
Albumin: 4.1 g/dL (ref 3.5–5.2)
Alkaline Phosphatase: 80 U/L (ref 39–117)
Glucose, Bld: 101 mg/dL — ABNORMAL HIGH (ref 70–99)
Potassium: 5 mEq/L (ref 3.5–5.3)
Sodium: 138 mEq/L (ref 135–145)
Total Bilirubin: 0.4 mg/dL (ref 0.3–1.2)
Total Protein: 6.7 g/dL (ref 6.0–8.3)

## 2009-10-08 LAB — CBC WITH DIFFERENTIAL/PLATELET
Eosinophils Absolute: 0.2 10*3/uL (ref 0.0–0.5)
HCT: 32.4 % — ABNORMAL LOW (ref 34.8–46.6)
HGB: 11 g/dL — ABNORMAL LOW (ref 11.6–15.9)
LYMPH%: 19.2 % (ref 14.0–49.7)
MONO#: 0.4 10*3/uL (ref 0.1–0.9)
NEUT#: 4 10*3/uL (ref 1.5–6.5)
NEUT%: 69.6 % (ref 38.4–76.8)
Platelets: 209 10*3/uL (ref 145–400)
RBC: 3.3 10*6/uL — ABNORMAL LOW (ref 3.70–5.45)
WBC: 5.8 10*3/uL (ref 3.9–10.3)
lymph#: 1.1 10*3/uL (ref 0.9–3.3)

## 2010-01-01 ENCOUNTER — Ambulatory Visit: Payer: Self-pay | Admitting: Oncology

## 2010-01-05 LAB — CBC WITH DIFFERENTIAL/PLATELET
BASO%: 0.2 % (ref 0.0–2.0)
Eosinophils Absolute: 0.2 10*3/uL (ref 0.0–0.5)
HCT: 32.2 % — ABNORMAL LOW (ref 34.8–46.6)
MCHC: 34.4 g/dL (ref 31.5–36.0)
MONO#: 0.3 10*3/uL (ref 0.1–0.9)
NEUT#: 4.3 10*3/uL (ref 1.5–6.5)
NEUT%: 76 % (ref 38.4–76.8)
RBC: 3.28 10*6/uL — ABNORMAL LOW (ref 3.70–5.45)
WBC: 5.6 10*3/uL (ref 3.9–10.3)
lymph#: 0.9 10*3/uL (ref 0.9–3.3)

## 2010-04-08 ENCOUNTER — Ambulatory Visit: Payer: Self-pay | Admitting: Oncology

## 2010-04-10 LAB — CBC WITH DIFFERENTIAL/PLATELET
BASO%: 0.1 % (ref 0.0–2.0)
LYMPH%: 18 % (ref 14.0–49.7)
MCHC: 34 g/dL (ref 31.5–36.0)
MCV: 97.4 fL (ref 79.5–101.0)
MONO#: 0.4 10*3/uL (ref 0.1–0.9)
MONO%: 6.5 % (ref 0.0–14.0)
NEUT#: 4 10*3/uL (ref 1.5–6.5)
Platelets: 212 10*3/uL (ref 145–400)
RBC: 3.33 10*6/uL — ABNORMAL LOW (ref 3.70–5.45)
RDW: 15.4 % — ABNORMAL HIGH (ref 11.2–14.5)
WBC: 5.7 10*3/uL (ref 3.9–10.3)

## 2010-04-22 ENCOUNTER — Ambulatory Visit: Payer: Self-pay | Admitting: Hematology & Oncology

## 2010-07-06 ENCOUNTER — Ambulatory Visit: Payer: Self-pay | Admitting: Oncology

## 2010-07-06 ENCOUNTER — Encounter: Payer: Self-pay | Admitting: Gastroenterology

## 2010-07-06 LAB — CBC WITH DIFFERENTIAL/PLATELET
EOS%: 5.3 % (ref 0.0–7.0)
Eosinophils Absolute: 0.3 10*3/uL (ref 0.0–0.5)
LYMPH%: 17.5 % (ref 14.0–49.7)
MCH: 33.9 pg (ref 25.1–34.0)
MCHC: 34.4 g/dL (ref 31.5–36.0)
MCV: 98.6 fL (ref 79.5–101.0)
MONO%: 6.5 % (ref 0.0–14.0)
Platelets: 182 10*3/uL (ref 145–400)
RBC: 3.11 10*6/uL — ABNORMAL LOW (ref 3.70–5.45)
RDW: 15.6 % — ABNORMAL HIGH (ref 11.2–14.5)

## 2010-07-24 ENCOUNTER — Encounter: Payer: Self-pay | Admitting: Gastroenterology

## 2010-08-13 ENCOUNTER — Telehealth: Payer: Self-pay | Admitting: Gastroenterology

## 2010-09-02 ENCOUNTER — Ambulatory Visit: Payer: Self-pay | Admitting: Gastroenterology

## 2010-09-02 DIAGNOSIS — R197 Diarrhea, unspecified: Secondary | ICD-10-CM

## 2010-09-02 DIAGNOSIS — K5730 Diverticulosis of large intestine without perforation or abscess without bleeding: Secondary | ICD-10-CM | POA: Insufficient documentation

## 2010-09-02 DIAGNOSIS — K649 Unspecified hemorrhoids: Secondary | ICD-10-CM | POA: Insufficient documentation

## 2010-10-01 ENCOUNTER — Ambulatory Visit: Payer: Self-pay | Admitting: Hematology & Oncology

## 2010-10-07 LAB — CBC WITH DIFFERENTIAL/PLATELET
BASO%: 0.2 % (ref 0.0–2.0)
Basophils Absolute: 0 10*3/uL (ref 0.0–0.1)
EOS%: 7 % (ref 0.0–7.0)
Eosinophils Absolute: 0.4 10*3/uL (ref 0.0–0.5)
HCT: 32.1 % — ABNORMAL LOW (ref 34.8–46.6)
HGB: 10.6 g/dL — ABNORMAL LOW (ref 11.6–15.9)
LYMPH%: 15.8 % (ref 14.0–49.7)
MCH: 32 pg (ref 25.1–34.0)
MCHC: 33 g/dL (ref 31.5–36.0)
MCV: 97 fL (ref 79.5–101.0)
MONO#: 0.3 10*3/uL (ref 0.1–0.9)
MONO%: 4.2 % (ref 0.0–14.0)
NEUT#: 4.5 10*3/uL (ref 1.5–6.5)
NEUT%: 72.8 % (ref 38.4–76.8)
Platelets: 184 10*3/uL (ref 145–400)
RBC: 3.31 10*6/uL — ABNORMAL LOW (ref 3.70–5.45)
RDW: 14.9 % — ABNORMAL HIGH (ref 11.2–14.5)
WBC: 6.1 10*3/uL (ref 3.9–10.3)
lymph#: 1 10*3/uL (ref 0.9–3.3)

## 2010-10-24 ENCOUNTER — Encounter: Payer: Self-pay | Admitting: Nephrology

## 2010-10-26 ENCOUNTER — Encounter: Payer: Self-pay | Admitting: Orthopedic Surgery

## 2010-10-27 ENCOUNTER — Ambulatory Visit
Admission: RE | Admit: 2010-10-27 | Discharge: 2010-10-27 | Payer: Self-pay | Source: Home / Self Care | Attending: Gastroenterology | Admitting: Gastroenterology

## 2010-10-27 ENCOUNTER — Other Ambulatory Visit: Payer: Self-pay | Admitting: Gastroenterology

## 2010-10-27 DIAGNOSIS — R197 Diarrhea, unspecified: Secondary | ICD-10-CM

## 2010-11-02 ENCOUNTER — Encounter: Payer: Self-pay | Admitting: Gastroenterology

## 2010-11-03 NOTE — Assessment & Plan Note (Signed)
Summary: diarrhea/[name]   History of Present Illness Visit Type: consult Primary GI MD: Elie Goody MD [facility] Primary Provider: Minda Meo, MD  Requesting Provider: Zada Girt, MD  Chief Complaint: Pt states that she has had diarrhea off and on since this summer when her daughter came home to visit from [place]. Pt is not taking any diarrhea medicine  History of Present Illness:   This is a 77 year old female who notes the onset of intermittent diarrhea since this summer described as 2-3 episodes of watery, nonbloody diarrhea, and then her bowel habits return to normal. She feels that red meat generally brings on her symptoms. She reports no weight loss, abdominal pain, change in stool caliber, melena or hematochezia. Patient reports Hemoccults sent to Dr. Scherrie Gerlach office were negative x3.  She underwent a colonoscopy in October 2003, which showed diverticulosis and hemorrhoids.  There is no family history of colon cancer, colon polyps, or inflammatory bowel disease. She has stable gout, hypertension, osteoporosis, and chronic kidney disease.   GI Review of Systems      Denies abdominal pain, acid reflux, belching, bloating, chest pain, dysphagia with liquids, dysphagia with solids, heartburn, loss of appetite, nausea, vomiting, vomiting blood, weight loss, and  weight gain.      Reports diarrhea.     Denies anal fissure, black tarry stools, change in bowel habit, constipation, diverticulosis, fecal incontinence, heme positive stool, hemorrhoids, irritable bowel syndrome, jaundice, light color stool, liver problems, rectal bleeding, and  rectal pain.   Current Medications (verified): 1)  Zemplar 1 Mcg Caps (Paricalcitol) .... One Capsule By Mouth Once Daily 2)  Cozaar 100 Mg Tabs (Losartan Potassium) .... One Tablet By Mouth Once Daily 3)  Allopurinol 300 Mg Tabs (Allopurinol) .... One Tablet By Mouth Once Daily  Allergies (verified): No Known Drug Allergies  Past  History:  Past Medical History: Hypertension Osteoporosis Chronic renal failure Ovarian Cancer Stage I, 2004 Hemorrhoids, Internal and  External Diverticulosis Gout   Past Surgical History: Appendectomy Hysterectomy Exploratory laparotomy, bilateral salpingo-oophorectomy, 2004  Family History: No FH of Colon Cancer: Family History of Kidney Disease: Father   Social History: Retired Professor Married Childern Patient is a former smoker.  Alcohol Use - no Daily Caffeine Use: one cup of tea occ  Illicit Drug Use - no Smoking Status:  quit Drug Use:  no  Review of Systems       The patient complains of arthritis/joint pain and back pain.         The pertinent positives and negatives are noted as above and in the HPI. All other ROS were reviewed and were negative.   Vital Signs:  Patient profile:   77 year old female Height:      69 inches Weight:      156 pounds BMI:     23.12 BSA:     1.86 Pulse rate:   88 / minute Pulse rhythm:   regular BP sitting:   136 / 82  (left arm) Cuff size:   regular  Vitals Entered By: Ok Anis CMA (September 02, 2010 2:57 PM)  Physical Exam  General:  Well developed, well nourished, no acute distress. Head:  Normocephalic and atraumatic. Eyes:  PERRLA, no icterus. Ears:  Normal auditory acuity. Mouth:  No deformity or lesions, dentition normal. Lungs:  Clear throughout to auscultation. Heart:  Regular rate and rhythm; no murmurs, rubs,  or bruits. Abdomen:  Soft, nontender and nondistended. No masses, hepatosplenomegaly or hernias noted. Normal  bowel sounds. Rectal:  Normal exam.deferred until time of colonoscopy.   Msk:  Symmetrical with no gross deformities. Normal posture. Pulses:  Normal pulses noted. Extremities:  No clubbing, cyanosis, edema or deformities noted. Neurologic:  Alert and  oriented x4;  grossly normal neurologically. Cervical Nodes:  No significant cervical adenopathy. Axillary Nodes:  No significant  axillary adenopathy. Psych:  Alert and cooperative. Normal mood and affect.  Impression & Recommendations:  Problem # 1:  DIARRHEA (ICD-787.91) Intermittent diarrhea with Hemoccult-negative stool. Symptoms appear to follow red meat intake. Rule out colorectal neoplasms, inflammatory bowel disease. The risks, benefits and alternatives to colonoscopy with possible biopsy and possible polypectomy were discussed with the patient and they consent to proceed. The procedure will be scheduled electively. Orders: Colonoscopy (Colon)  Problem # 2:  DIVERTICULOSIS, COLON (ICD-562.10) Long-term high fiber diet with adequate water intake.  Patient Instructions: 1)  Pick up your prep from your pharmacy.  2)  Colonoscopy brochure given.  3)  Copy sent to : Marina Gravel, MD 4)                        Geoffry Paradise, MD 5)  The medication list was reviewed and reconciled.  All changed / newly prescribed medications were explained.  A complete medication list was provided to the patient / caregiver.  Prescriptions: MOVIPREP 100 GM  SOLR (PEG-KCL-NACL-NASULF-NA ASC-C) As per prep instructions.  #1 x 0   Entered by:   Christie Nottingham CMA (AAMA)   Authorized by:   Meryl Dare MD [facility]   Signed by:   Christie Nottingham CMA Duncan Dull) on 09/02/2010   Method used:   Electronically to        [facility]* (retail)       [address]       [place], [place]  [id number]       Ph: [phone]       Fax: [phone]   RxID:   [id number]

## 2010-11-03 NOTE — Progress Notes (Signed)
Summary: triage  Phone Note From Other Clinic Call back at Home Phone [phone]   Caller: Dr. Caryn Section Call For: Dr. [name] Reason for Call: Schedule Patient Appt Summary of Call: wants pt worked in for diarrhea... furious that next available appt was not until December... said that our practice has too many patients and obviously doesnt care whether theyre seen or not... also stated that if pt cant get in sooner, he will advise his pt to go elsewhere... wants the pt called back directly to sch this sooner appt Initial call taken by: Vallarie Mare,  August 13, 2010 12:10 PM  Follow-up for Phone Call        I spoke with the patient and she has had diarrhea 2-3 times a week for the last several weeks.  Patient  is offered an appointment with Dr [name] on 09/02/10 3:00 or an extender next week.  Patient wants to see  Dr [name] on 09/02/10 Follow-up by: Darcey Nora RN, CGRN,  August 13, 2010 2:30 PM  Additional Follow-up for Phone Call Additional follow up Details #1::        Revonda Standard spoke with the patient earlier in the day when she called to schedule and patient refused to give Revonda Standard any further information and didn't want a message sent to a nurse for triage.  Patient  then called Dr Caryn Section.  Additional Follow-up by: Darcey Nora RN, CGRN,  August 13, 2010 2:32 PM    Additional Follow-up for Phone Call Additional follow up Details #2::    Above noted. It does not appear that I have seen her recently. PCP or extended could provide an interim evaluation until she has appt with me. Follow-up by: Meryl Dare MD Clementeen Graham,  August 13, 2010 6:04 PM

## 2010-11-03 NOTE — Procedures (Signed)
Summary: Colonoscopy   Colonoscopy  Procedure date:  07/10/2002  Findings:      Results: Hemorrhoids.     Results: Diverticulosis.       Location:  [facility].    Procedures Next Due Date:    Colonoscopy: 07/2009  Colonoscopy  Procedure date:  07/10/2002  Findings:      Results: Hemorrhoids.     Results: Diverticulosis.       Location:  [facility].    Procedures Next Due Date:    Colonoscopy: 07/2009 Patient Name: [name], [name] MRN:  Procedure Procedures: Colonoscopy CPT: [address].  Personnel: Endoscopist: Venita Lick. [name], MD, Clementeen Graham.  Referred By: Pearletha Furl Jacky Kindle, MD.  Exam Location: Exam performed in Outpatient Clinic. Outpatient  Patient Consent: Procedure, Alternatives, Risks and Benefits discussed, consent obtained, from patient. Consent was obtained by the RN.  Indications  Average Risk Screening Routine.  History  Pre-Exam Physical: Performed Jul 10, 2002. Cardio-pulmonary exam WNL. Rectal exam abnormal. HEENT exam , Abdominal exam, Extremity exam, Neurological exam, Mental status exam WNL. Abnormal PE findings include: ext. hem.  Exam Exam: Extent of exam reached: Cecum, extent intended: Cecum.  The cecum was identified by appendiceal orifice and IC valve. Colon retroflexion performed. ASA Classification: II. Tolerance: good.  Monitoring: Pulse and BP monitoring, Oximetry used. Supplemental O2 given.  Colon Prep Used Golytely for colon prep. Prep results: fair, adequate exam.  Sedation Meds: Patient assessed and found to be appropriate for moderate (conscious) sedation. Fentanyl 100 mcg. given IV. Versed 5 mg. given IV.  Findings DIVERTICULOSIS: Transverse Colon.  NORMAL EXAM: Splenic Flexure to Descending Colon.  NORMAL EXAM: Cecum to Hepatic Flexure.  DIVERTICULOSIS: Sigmoid Colon.  HEMORRHOIDS: Internal and External. Size: Medium. Not bleeding. Not thrombosed. ICD9: Hemorrhoids, Internal and  External: 455.6.    Assessment  Diagnoses: 455.6: Hemorrhoids, Internal and  External.  562.10: Diverticulosis.   Events  Unplanned Interventions: No intervention was required.  Unplanned Events: There were no complications. Plans Medication Plan: Continue current medications.  Patient Education: Patient given standard instructions for: Diverticulosis. Hemorrhoids.  Disposition: After procedure patient sent to recovery. After recovery patient sent home.  Scheduling/Referral: Colonoscopy, to [facility] T. [name], MD, [facility], around Jul 10, 2009.  Primary Care Provider, to Richard A. Jacky Kindle, MD,    This report was created from the original endoscopy report, which was reviewed and signed by the above listed endoscopist.    cc: Richard A. Jacky Kindle, MD

## 2010-11-03 NOTE — Letter (Signed)
Summary: [facility]  [facility]   Imported By: Lester Clearbrook 09/07/2010 10:11:12  _____________________________________________________________________  External Attachment:    Type:   Image     Comment:   External Document

## 2010-11-03 NOTE — Letter (Signed)
Summary: [facility] Instructions  [facility]  [address] [place], [place] [address]   Phone: [phone]  Fax: [phone]       [name]    1931-06-15    MRN: [id number]        Procedure Day /Date: Tuesday January 24th, 2012     Arrival Time: 10:00am     Procedure Time: 11:00am     Location of Procedure:                    _x _  [facility] (4th Floor)                        PREPARATION FOR COLONOSCOPY WITH MOVIPREP   Starting 5 days prior to your procedure 10/22/10 do not eat nuts, seeds, popcorn, corn, beans, peas,  salads, or any raw vegetables.  Do not take any fiber supplements (e.g. Metamucil, Citrucel, and Benefiber).  THE DAY BEFORE YOUR PROCEDURE         DATE: 10/26/10  DAY: Monday  1.  Drink clear liquids the entire day-NO SOLID FOOD  2.  Do not drink anything colored red or purple.  Avoid juices with pulp.  No orange juice.  3.  Drink at least 64 oz. (8 glasses) of fluid/clear liquids during the day to prevent dehydration and help the prep work efficiently.  CLEAR LIQUIDS INCLUDE: Water Jello Ice Popsicles Tea (sugar ok, no milk/cream) Powdered fruit flavored drinks Coffee (sugar ok, no milk/cream) Gatorade Juice: apple, white grape, white cranberry  Lemonade Clear bullion, consomm, broth Carbonated beverages (any kind) Strained chicken noodle soup Hard Candy                             4.  In the morning, mix first dose of MoviPrep solution:    Empty 1 Pouch A and 1 Pouch B into the disposable container    Add lukewarm drinking water to the top line of the container. Mix to dissolve    Refrigerate (mixed solution should be used within 24 hrs)  5.  Begin drinking the prep at 5:00 p.m. The MoviPrep container is divided by 4 marks.   Every 15 minutes drink the solution down to the next mark (approximately 8 oz) until the full liter is complete.   6.  Follow completed prep with 16 oz of clear liquid of your choice  (Nothing red or purple).  Continue to drink clear liquids until bedtime.  7.  Before going to bed, mix second dose of MoviPrep solution:    Empty 1 Pouch A and 1 Pouch B into the disposable container    Add lukewarm drinking water to the top line of the container. Mix to dissolve    Refrigerate  THE DAY OF YOUR PROCEDURE      DATE: 10/27/10 DAY: Tuesday  Beginning at 6:00 a.m. (5 hours before procedure):         1. Every 15 minutes, drink the solution down to the next mark (approx 8 oz) until the full liter is complete.  2. Follow completed prep with 16 oz. of clear liquid of your choice.    3. You may drink clear liquids until 9:00am (2 HOURS BEFORE PROCEDURE).   MEDICATION INSTRUCTIONS  Unless otherwise instructed, you should take regular prescription medications with a small sip of water   as early as possible the morning of your  procedure.        OTHER INSTRUCTIONS  You will need a responsible adult at least 75 years of age to accompany you and drive you home.   This person must remain in the waiting room during your procedure.  Wear loose fitting clothing that is easily removed.  Leave jewelry and other valuables at home.  However, you may wish to bring a book to read or  an iPod/MP3 player to listen to music as you wait for your procedure to start.  Remove all body piercing jewelry and leave at home.  Total time from sign-in until discharge is approximately 2-3 hours.  You should go home directly after your procedure and rest.  You can resume normal activities the  day after your procedure.  The day of your procedure you should not:   Drive   Make legal decisions   Operate machinery   Drink alcohol   Return to work  You will receive specific instructions about eating, activities and medications before you leave.    The above instructions have been reviewed and explained to me by   Marchelle Folks.     I fully understand and can verbalize these  instructions _____________________________ Date _________

## 2010-11-05 NOTE — Procedures (Addendum)
Summary: Colonoscopy  Patient: [name] Note: All result statuses are Final unless otherwise noted.  Tests: (1) Colonoscopy (COL)   COL Colonoscopy           DONE     [facility]     [address]     [place], [place]  [address]           COLONOSCOPY PROCEDURE REPORT     PATIENT:  [name], [name]  MR#:  [id number]     BIRTHDATE:  1931/07/03, 79 yrs. old  GENDER:  female     ENDOSCOPIST:  Judie Petit T. [name], MD, [facility]           PROCEDURE DATE:  10/27/2010     PROCEDURE:  Colonoscopy with snare polypectomy, w/ inj sclerosis     of hemorrhoids     ASA CLASS:  Class II     INDICATIONS:  1) unexplained diarrhea 2) change in bowel habits     MEDICATIONS:   Fentanyl 50 mcg IV, Versed 7 mg IV     DESCRIPTION OF PROCEDURE:   After the risks benefits and     alternatives of the procedure were thoroughly explained, informed     consent was obtained.  Digital rectal exam was performed and     revealed moderate external hemorrhoids. The LB [id number]     endoscope was introduced through the anus and advanced to the     cecum, which was identified by both the appendix and ileocecal     valve, without limitations.  The quality of the prep was     excellent, using MoviPrep.  The instrument was then slowly     withdrawn as the colon was fully examined.     <<PROCEDUREIMAGES>>     FINDINGS:  A sessile polyp was found in the cecum. It was 5 mm in     size. Polyp was snared without cautery. Retrieval was successful.     A sessile polyp was found at the hepatic flexure. It was 5 mm in     size. Polyp was snared without cautery. Retrieval was successful.     Moderate diverticulosis was found in the sigmoid to hepatic     flexure, more heavy concentrated in the left colon. Internal     hemorrhoids were found in the rectum. They were moderately sized.     Injection sclerosis of internal hemorrhoids performed well above     the dentate line with 2 cc of 23.4% saline. Otherwise normal  colonoscopy without other polyps, masses, vascular ectasias, or     inflammatory changes. Retroflexed views in the rectum revealed no     other findings other than those already described. The time to     cecum =  2.5  minutes. The scope was then withdrawn (time =  12     min) from the patient and the procedure completed.           COMPLICATIONS:  None           ENDOSCOPIC IMPRESSION:     1) 5 mm sessile polyp in the cecum     2) 5 mm sessile polyp at the hepatic flexure     3) Moderate diverticulosis in the sigmoid to hepatic flexure     4) Internal and external hemorrhoids           RECOMMENDATIONS:     1) Await pathology results     2) High fiber diet with liberal  fluid intake.     3) If the polyps are adenomatous (pre-cancerous), colonoscopy in     5 years. Otherwise you should continue to follow colorectal cancer     screening guidelines for "routine risk" patients with colonoscopy     in 10 years.           Venita Lick. [name], MD, Clementeen Graham           CC:  Marina Gravel, MD, Geoffry Paradise, MD           n.     [name] at 10/27/2010 11:50 AM           [name], [id number]  Note: An exclamation mark (!) indicates a result that was not dispersed into the flowsheet. Document Creation Date: 10/27/2010 11:51 AM _______________________________________________________________________  (1) Order result status: Final Collection or observation date-time: 10/27/2010 11:40 Requested date-time:  Receipt date-time:  Reported date-time:  Referring Physician:   Ordering Physician: Claudette Head [phone]) Specimen Source:  Source: [name] Order Number: [id number] Lab site:   Appended Document: Colonoscopy     Procedures Next Due Date:    Colonoscopy: 10/2020

## 2010-11-11 NOTE — Letter (Signed)
Summary: Patient Notice-Hyperplastic Polyps  [facility]  [address] [place], [place] [address]   Phone: [phone]  Fax: [phone]        November 02, 2010 MRN: [id number]    [facility] [address], [place]  [address]    Dear Ms. [name],  I am pleased to inform you that the colon polyp(s) removed during your recent colonoscopy was (were) found to be hyperplastic. These types of polyps are NOT pre-cancerous.  Should you develop new or worsening symptoms of abdominal pain, bowel habit changes or bleeding from the rectum or bowels, please schedule an evaluation with either your primary care physician or with me.  Continue treatment plan as outlined the day of your exam.  Please call us if you are having persistent problems or have questions about your condition that have not been fully answered at this time.  Sincerely,  Meryl Dare MD [facility]  This letter has been electronically signed by your physician.  Appended Document: Patient Notice-Hyperplastic Polyps LETTER MAILED

## 2010-11-29 IMAGING — CT CT EXTREM UP W/O CM*R*
3 of 4 series · 10 of 20 positions shown, 11 images · non-contrast
Comparison: None

CLINICAL DATA: Shoulder pain status post fall 2 days ago.
Evaluate right humeral fracture.

CT OF THE RIGHT SHOULDER WITHOUT CONTRAST
TECHNIQUE: Multidetector CT imaging of the right shoulder was
performed according to the standard protocol without intravenous
contrast. Multiplanar CT image reconstructions were also generated.

[Series 3: shoulder/standard · axial · 0.47mm/px · z∈[+5,+90]mm · 4 of 114 slices shown]
[im 23/114  soft-tissue]
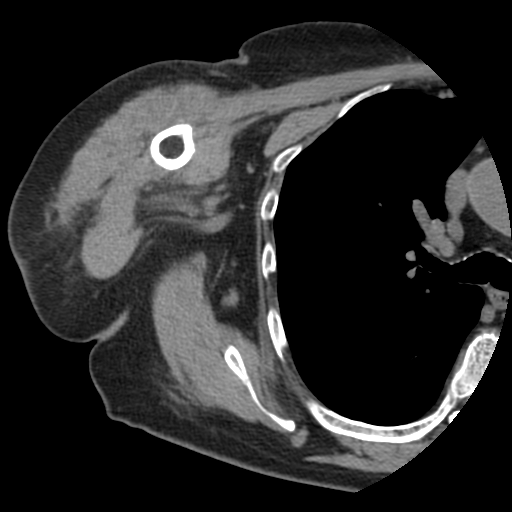
[im 46/114  soft-tissue]
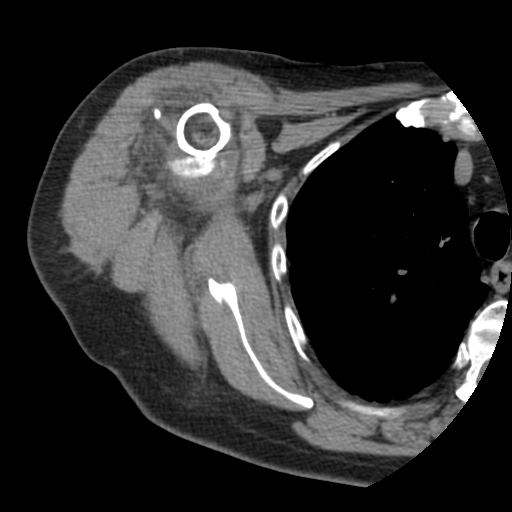
[im 68/114  soft-tissue]
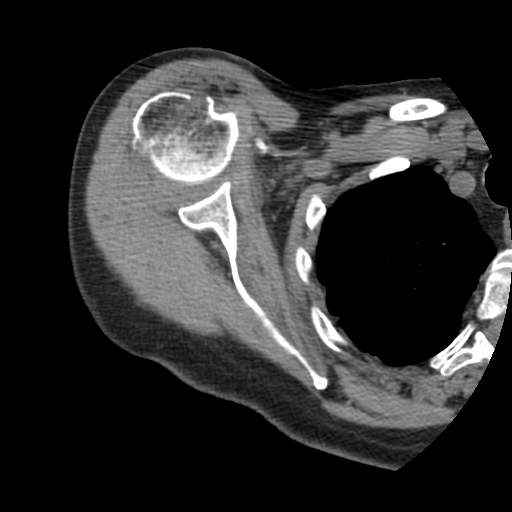
[im 91/114  soft-tissue]
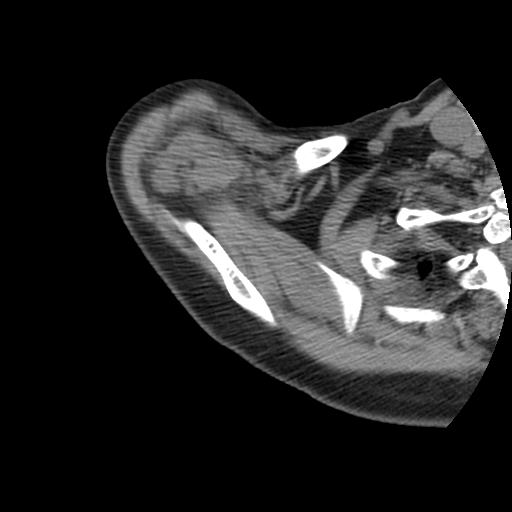

[Series 401: cor · coronal · 0.47mm/px · 3 of 84 slices shown, 4 images]
[im 21/84  soft-tissue]
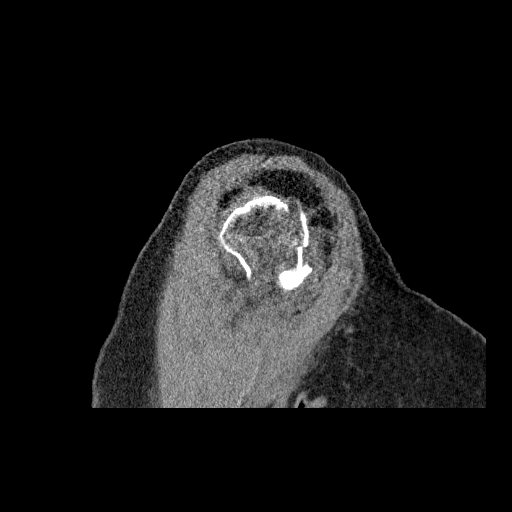
[im 21/84  bone]
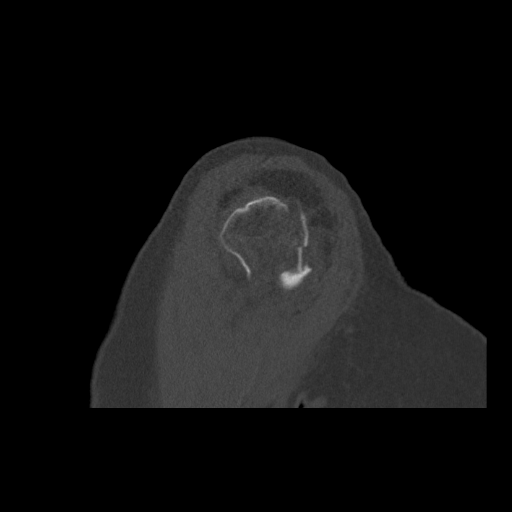
[im 42/84  bone]
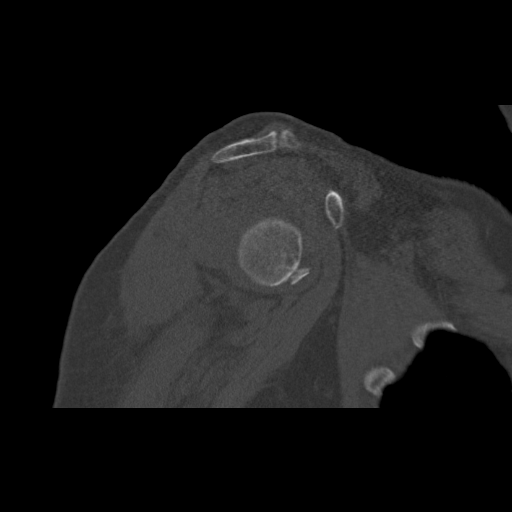
[im 63/84  bone]
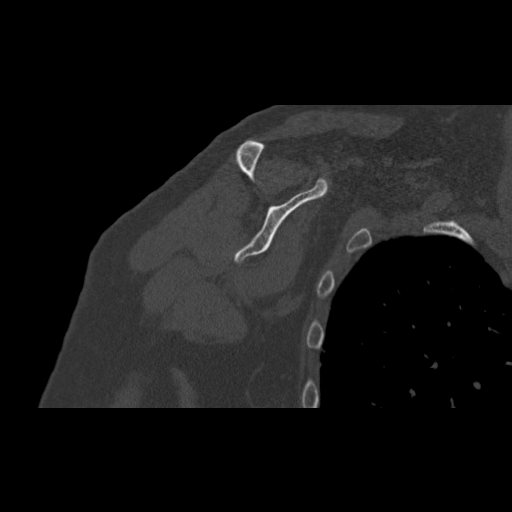

[Series 402: reformatted · coronal · 0.47mm/px · 3 of 96 slices shown]
[im 20/96  bone]
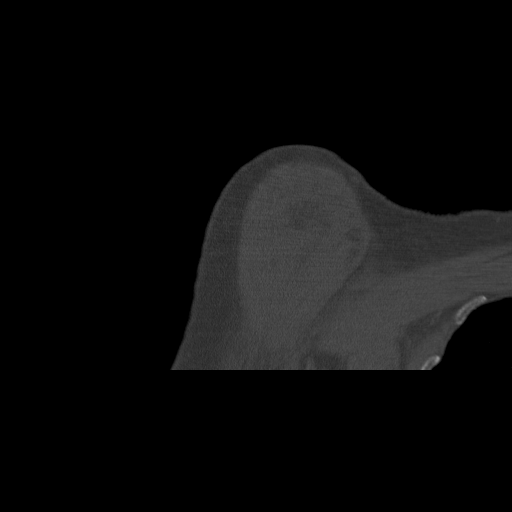
[im 39/96  bone]
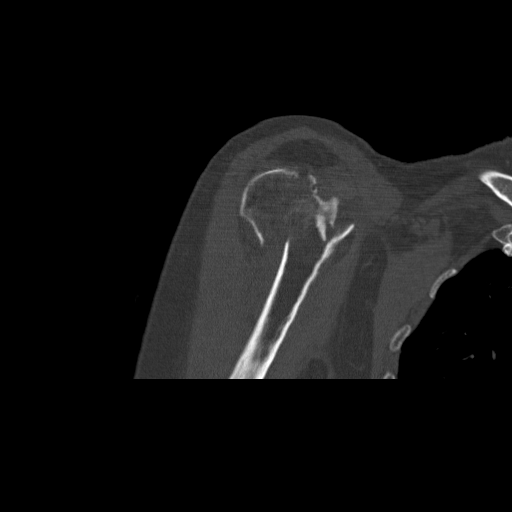
[im 58/96  bone]
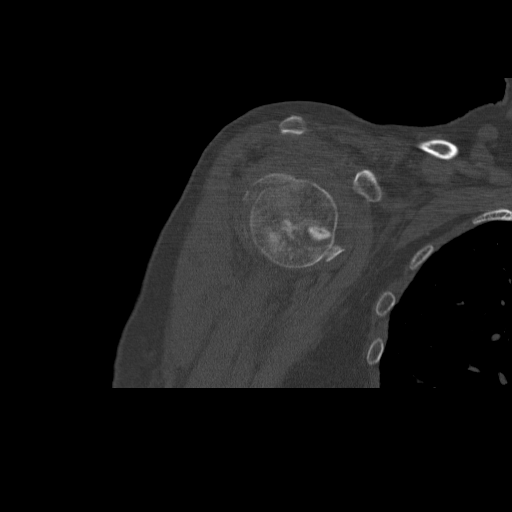

[10 of 20 positions shown; findings below may reference images not displayed]

FINDINGS: There is a moderately comminuted fracture through the
surgical neck of the right humerus.  This demonstrates up to 13 mm
of anterior displacement as well as apex anterior angulation.  The
fracture extends through the greater tuberosity and involves the
superior aspect of the humeral head.  The majority of the articular
surface is not involved.

There is a moderate sized lipohemarthrosis with associated inferior
subluxation of the humeral head.  There is no dislocation.  There
is no evidence of glenoid fracture.  The remainder of the
visualized scapula, right clavicle and right ribs are intact.

Pleuroparenchymal scarring is noted in the upper right lung.  There
are no suspicious pulmonary findings.
IMPRESSION: Mildly displaced and comminuted intra-articular fracture of the
proximal right humerus as described with associated
lipohemarthrosis.  No dislocation or scapular fracture
demonstrated.

## 2010-12-02 IMAGING — RF DG SHOULDER 2+V*R*
1 series · 5 of 5 positions shown · non-contrast
Comparison: CT examination 02/17/2009

CLINICAL DATA: Shoulder fracture

RIGHT SHOULDER - 2+ VIEW

[Series 1: run · 5 of 5 slices shown]
[im 1/5]
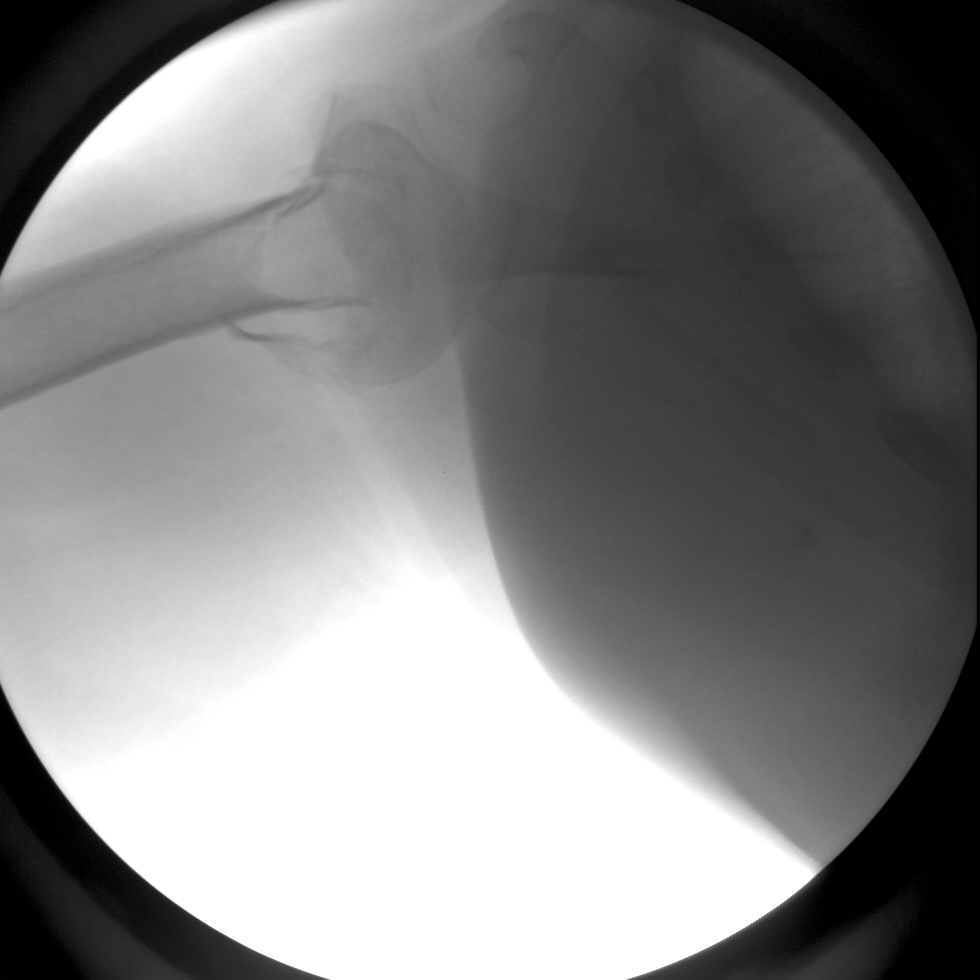
[im 2/5]
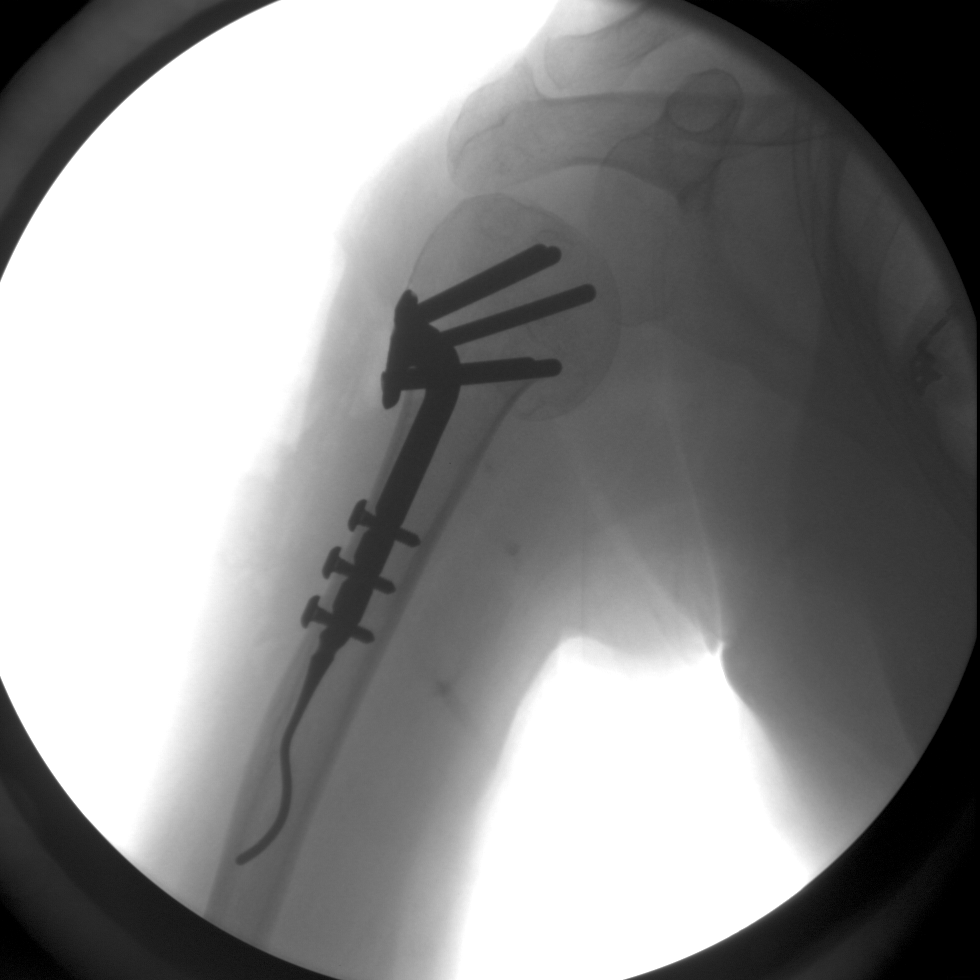
[im 3/5]
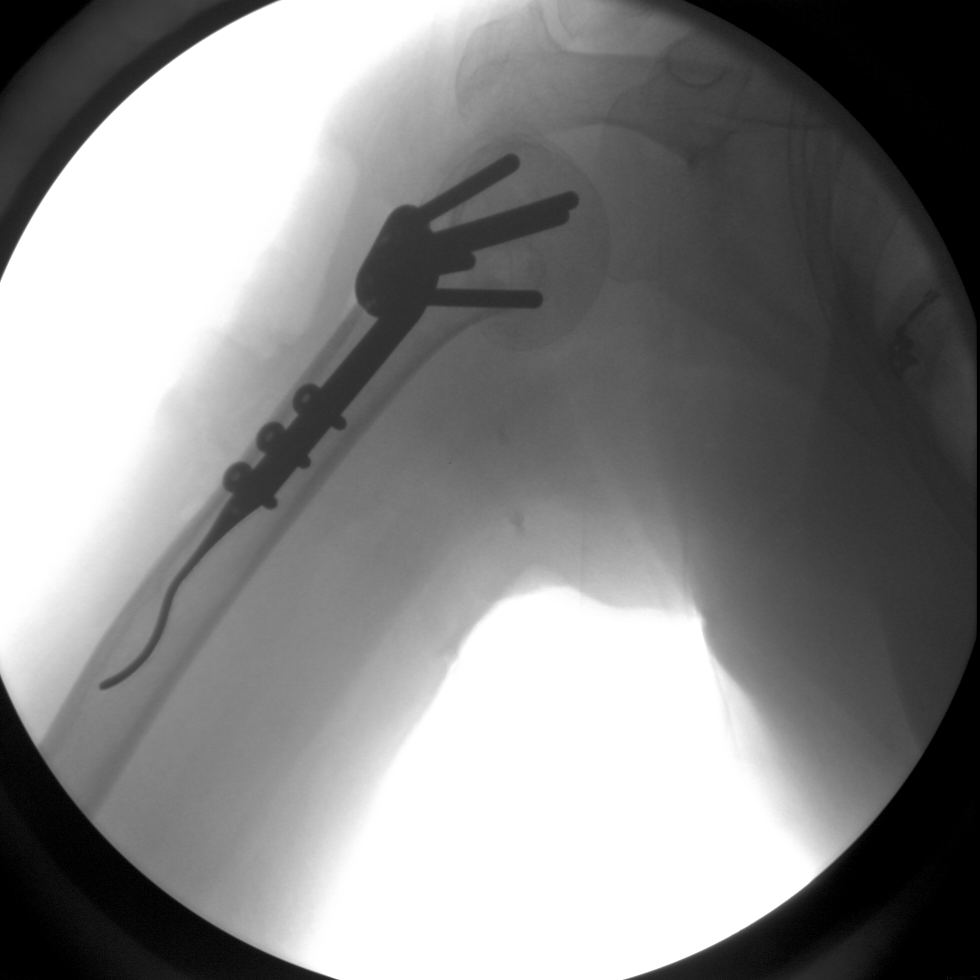
[im 4/5]
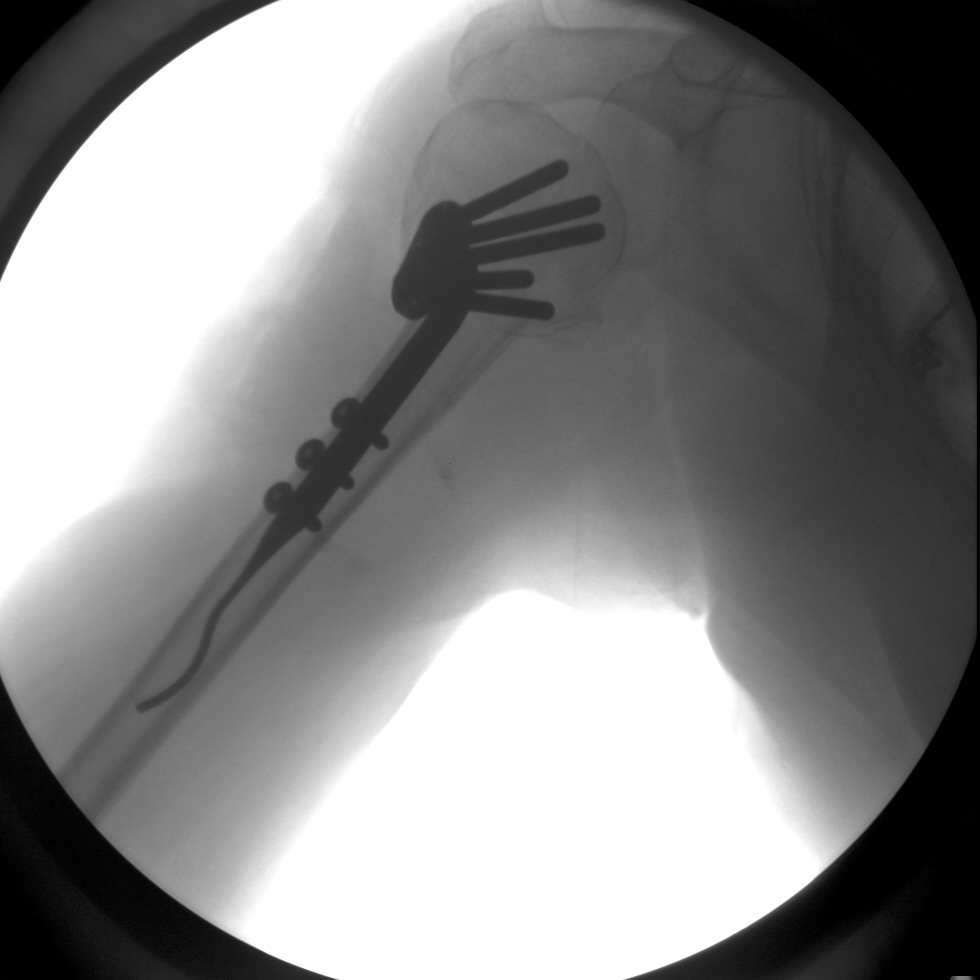
[im 5/5]
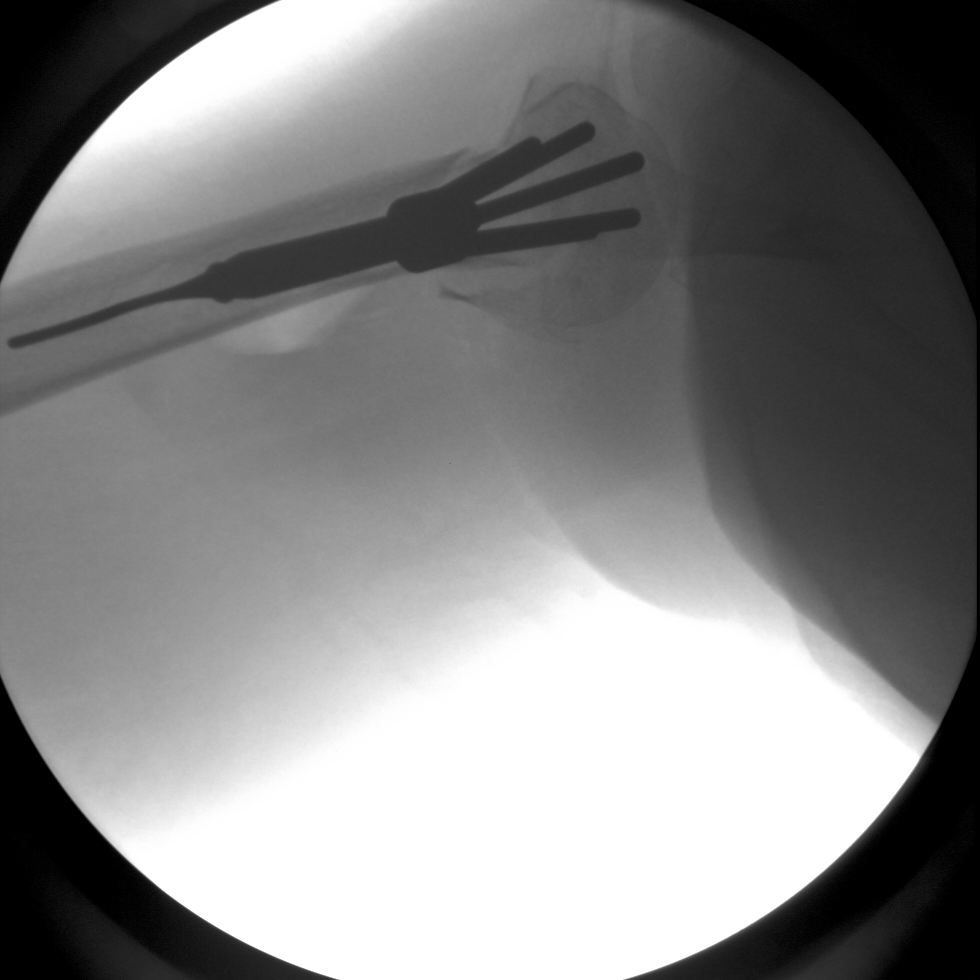

[5 of 5 positions shown; findings below may reference images not displayed]

FINDINGS: The patient has undergone ORIF humeral neck fracture.
Hardware well positioned the humeral head is located within the
glenoid.
IMPRESSION: Satisfactory appearance status post ORIF right humeral fracture.
No perioperative complication identified.

## 2011-01-04 ENCOUNTER — Other Ambulatory Visit: Payer: Self-pay | Admitting: Family

## 2011-01-04 ENCOUNTER — Encounter (HOSPITAL_BASED_OUTPATIENT_CLINIC_OR_DEPARTMENT_OTHER): Payer: Medicare Other | Admitting: Oncology

## 2011-01-04 DIAGNOSIS — N189 Chronic kidney disease, unspecified: Secondary | ICD-10-CM

## 2011-01-04 DIAGNOSIS — D649 Anemia, unspecified: Secondary | ICD-10-CM

## 2011-01-04 LAB — CBC WITH DIFFERENTIAL/PLATELET
Eosinophils Absolute: 0.3 10*3/uL (ref 0.0–0.5)
HCT: 32.5 % — ABNORMAL LOW (ref 34.8–46.6)
HGB: 10.9 g/dL — ABNORMAL LOW (ref 11.6–15.9)
LYMPH%: 21.4 % (ref 14.0–49.7)
MONO%: 6.3 % (ref 0.0–14.0)
RBC: 3.31 10*6/uL — ABNORMAL LOW (ref 3.70–5.45)
WBC: 5.9 10*3/uL (ref 3.9–10.3)

## 2011-01-06 ENCOUNTER — Encounter (HOSPITAL_BASED_OUTPATIENT_CLINIC_OR_DEPARTMENT_OTHER): Payer: Medicare Other | Admitting: Oncology

## 2011-01-06 DIAGNOSIS — D649 Anemia, unspecified: Secondary | ICD-10-CM

## 2011-01-06 DIAGNOSIS — N189 Chronic kidney disease, unspecified: Secondary | ICD-10-CM

## 2011-01-12 LAB — COMPREHENSIVE METABOLIC PANEL
BUN: 63 mg/dL — ABNORMAL HIGH (ref 6–23)
CO2: 24 mEq/L (ref 19–32)
Chloride: 102 mEq/L (ref 96–112)
Creatinine, Ser: 2.65 mg/dL — ABNORMAL HIGH (ref 0.4–1.2)
GFR calc non Af Amer: 18 mL/min — ABNORMAL LOW (ref >60)
Potassium: 4.5 mEq/L (ref 3.5–5.1)
Sodium: 134 mEq/L — ABNORMAL LOW (ref 135–145)
Total Bilirubin: 0.8 mg/dL (ref 0.3–1.2)

## 2011-01-12 LAB — CBC
HCT: 32.3 % — ABNORMAL LOW (ref 36.0–46.0)
Hemoglobin: 10.9 g/dL — ABNORMAL LOW (ref 12.0–15.0)
MCV: 97 fL (ref 78.0–100.0)
Platelets: 239 10*3/uL (ref 150–400)
WBC: 7.3 10*3/uL (ref 4.0–10.5)

## 2011-01-12 LAB — PROTIME-INR
INR: 1 (ref 0.00–1.49)
Prothrombin Time: 13.4 seconds (ref 11.6–15.2)

## 2011-01-12 LAB — APTT: aPTT: 26 seconds (ref 24–37)

## 2011-01-12 LAB — BASIC METABOLIC PANEL
BUN: 51 mg/dL — ABNORMAL HIGH (ref 6–23)
Calcium: 9.1 mg/dL (ref 8.4–10.5)
GFR calc non Af Amer: 18 mL/min — ABNORMAL LOW (ref >60)
Glucose, Bld: 115 mg/dL — ABNORMAL HIGH (ref 70–99)
Sodium: 135 mEq/L (ref 135–145)

## 2011-01-12 LAB — URINALYSIS, ROUTINE W REFLEX MICROSCOPIC
Bilirubin Urine: NEGATIVE
Specific Gravity, Urine: 1.011 (ref 1.005–1.030)
Urobilinogen, UA: 0.2 mg/dL (ref 0.0–1.0)

## 2011-01-12 LAB — URINE MICROSCOPIC-ADD ON

## 2011-02-16 NOTE — Op Note (Signed)
[name], [name] NO.:  [id number]   MEDICAL RECORD NO.:  [id number]          PATIENT TYPE:  OIB   LOCATION:  5029                         FACILITY:  [facility]   PHYSICIAN:  Vania Rea. Supple, M.D.  DATE OF BIRTH:  06-17-1931   DATE OF PROCEDURE:  02/20/2009  DATE OF DISCHARGE:                               OPERATIVE REPORT   PREOPERATIVE DIAGNOSIS:  Displaced right two-part proximal humerus  fracture.   POSTOPERATIVE DIAGNOSIS:  Displaced right two-part proximal humerus  fracture.   PROCEDURE:  Open reduction and internal fixation of displaced right two-  part proximal humerus fracture using a DePuy SNP plate.   SURGEON:  Vania Rea. Supple, MD   ASSISTANT:  Lucita Lora. Shuford, PA-C.   ANESTHESIA:  General endotracheal as well as an interscalene block.   ESTIMATED BLOOD LOSS:  200 mL.   DRAINS:  None.   HISTORY:  [name] is a 77 year old female who fell injuring her right  shoulder, sustaining a displaced two-part proximal humeral fracture.  Her initial radiograph show significant displacement and angulation and  subsequent CT scan did confirm the degree of displacement.  She was  counseled on treatment options and risks versus benefits thereof.  Possible complications bleeding, infection, neurovascular injury,  malunion, nonunion, loss of fixation and possible need for additional  surgery are all reviewed.  She understands and accepts and agrees to  planned procedure.   PROCEDURE IN DETAIL:  After undergoing routine preop evaluation, the  patient received prophylactic antibiotics and an interscalene block was  established in holding area by the Anesthesia Department.  Placed supine  on the operative table and underwent smooth induction of general  endotracheal anesthesia.  Placed into a gentle minimal beach-chair  position and appropriately padded and protected.  Right shoulder region  was sterilely prepped and draped in standard fashion.  We did  obtain  initial fluoroscopic images to confirm overall proper alignment and  reduction maneuver was then performed.  We then made a 5-cm lateral  deltoid splitting incision beginning lateral margin of the acromion  extending distally.  The deltoid fibers were then split along the  incision.  Care was taken to avoid dissecting any further than 5 cm from  the acromion.  We then gained access to the proximal humerus of the  fracture site with subperiosteal dissection exposed.  The fracture site  was then introduced the starting awl into the humeral shaft.  We then  passed the SNP plate into proper position within the humoral medullary  canal and then used fluoroscopic guidance to confirm proper positioning  of the implant.  We then placed the series of stabilizing pegs up  through the plate into the humeral head and again used fluoroscopic  imaging to confirm proper positioning and alignment.  We then used the  outrigger guide and placed 3 distal locking screws into the SNP plate.  Final evaluation of the construct showed good alignment at the fracture  site and good position of the hardware and good stability of the  construct.  The wound was then copiously  irrigated.  It was closed with  interrupted 0 Vicryl for the fascia of the  deltoid, 2-0 Vicryl for subcu and intracuticular 3-0 Monocryl for the  skin followed by Steri-Strips.  A dry dressing was then applied.  The  right arm was placed into a sling and immobilizer.  The patient was then  awakened, extubated and taken to the recovery room in stable condition.      Vania Rea. Supple, M.D.  Electronically Signed     KMS/MEDQ  D:  02/20/2009  T:  02/21/2009  Job:  [id number]

## 2011-02-16 NOTE — Consult Note (Signed)
[name], ROG NO.:  [id number]   MEDICAL RECORD NO.:  [id number]          PATIENT TYPE:  OUT   LOCATION:  GYN                          FACILITY:  [facility]   PHYSICIAN:  De Blanch, M.D.DATE OF BIRTH:  [birth year]   DATE OF CONSULTATION:  11/14/2007  DATE OF DISCHARGE:                                 CONSULTATION   CHIEF COMPLAINT:  Ovarian cancer.   INTERVAL HISTORY:  Since her last visit, the patient returns today  having had no interval problems.  She specifically denies any GI or GU  symptoms.  There is no pelvic pain, pressure, vaginal bleed or  discharge.  Her functional status is excellent.  It is noted that  today's visit constitutes approximately a 5-year anniversary visit.   HISTORY OF PRESENT ILLNESS:  The patient underwent initial surgical  resection and staging January 2004.  Final pathology showed a stage IC  grade 2 ovarian cancer.  She was treated with three cycles of  carboplatin and Taxotere.  CA-125 returned to normal and has been  followed since that time.  It is noted that her last CA-125 was 19 on  November 09, 2007.   PAST MEDICAL HISTORY:   MEDICAL ILLNESSES:  Chronic renal failure, hypertension.   DRUG ALLERGIES:  None.   CURRENT MEDICATIONS:  Cozaar, Zemplar, allopurinol and Fosamax.   FAMILY HISTORY:  Negative for gynecologic, breast or colon cancer.   SOCIAL HISTORY:  The patient is married.  She does not smoke.   REVIEW OF SYSTEMS:  A 10-point comprehensive review of systems negative  except as noted above.   PHYSICAL EXAM:  Weight 160 pounds, blood pressure 120/70, pulse 70,  respiratory rate 18.  GENERAL:  The patient is a healthy, white female in no acute distress.  HEENT:  Negative.  NECK:  Supple without thyromegaly. There is no supraclavicular or  inguinal adenopathy.  ABDOMEN:  Soft, nontender.  No mass, organomegaly, ascites or hernias  are noted.  PELVIC:  EGBUS, vagina, bladder, urethra are  normal.  Cervix and uterus  surgically absent.  Adnexa without masses.  Rectovaginal exam confirms.  EXTREMITIES:  Lower extremities without edema or varicosities.   IMPRESSION:  Stage IC ovarian cancer January 2004. Clinically free of  disease with a normal CA-125.   PLAN:  At this juncture, we will release the patient to the care of her  primary gynecologist, Dr. Leda Quail. Would recommend that she have  annual examinations and annual mammograms.  She will obtain annual CA-  125 levels when she sees Dr. Myna Hidalgo here at the cancer center.      De Blanch, M.D.  Electronically Signed     DC/MEDQ  D:  11/14/2007  T:  11/16/2007  Job:  [id number]   cc:   Telford Nab, R.N.  [address]  [place], [place] [address]   Wilber Bihari. Caryn Section, M.D.  Fax: [phone]   M. Leda Quail, MD  Fax: [phone]   Geoffry Paradise, M.D.  Fax: [phone]   Rose Phi. Myna Hidalgo, M.D.  Fax: [phone]

## 2011-02-19 NOTE — Consult Note (Signed)
NAME:  [name], [name] NO.:  [id number]   MEDICAL RECORD NO.:  [id number]                   PATIENT TYPE:  OUT   LOCATION:  GYN                                  FACILITY:  [facility]   PHYSICIAN:  De Blanch, M.D.         DATE OF BIRTH:  [birth year]   DATE OF CONSULTATION:  DATE OF DISCHARGE:                                   CONSULTATION   HISTORY OF PRESENT ILLNESS:  A 77 year old white married female seen in  consultation at the request of Laqueta Linden, M.D. regarding management of a  newly diagnosed abdominopelvic mass.  The patient dates her symptoms back to  the summer of this year when she was noticing increasing fatigue while  gardening.  She really has had no significant abdominal or pelvic pain,  however.  In the course of her annual gynecologic exam by Laqueta Linden,  M.D. on September 17, 2002, she was found to have an abdominopelvic mass.  This has been further evaluated with a CT of the abdomen and pelvis,  demonstrating a 15 x 14.5 x 13.5 cm mass in the left lower abdomen.  There  is no ascites, inflammation, or adenopathy seen.  The upper abdomen appears  normal.  She also has some sigmoid diverticulosis.  CA-125 value is 315  units per mL.  The patient denies any GI or GU symptoms and denies any  pelvic pain or pressure.   PAST MEDICAL HISTORY:  Medical illnesses:  Mild hypertension, osteoporosis,  anemia.   PAST SURGICAL HISTORY:  Vaginal hysterectomy in 1988 for abnormal Pap smear,  appendectomy.   DRUG ALLERGIES:  None.   CURRENT MEDICATIONS:  Hyzaar, Fosamax, Premarin 0.625 mg q.d.   FAMILY HISTORY:  Negative for gynecologic, breast, or colon cancer.   SOCIAL HISTORY:  The patient is married.  She has three living children.  She is a past smoker but has discontinued smoking 12 years ago.  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   REVIEW OF SYSTEMS:  Negative except as noted above.   PHYSICAL EXAMINATION:  VITAL SIGNS:  Weight 150 pounds, height 5 feet 8  inches, blood pressure 130/76, pulse 96.  GENERAL:  The patient is a healthy white female in no acute distress.  HEENT:  Negative.  NECK:  Supple without thyromegaly.  There is no supraclavicular or inguinal  adenopathy.  ABDOMEN:  Soft and nontender.  She has a palpable mass in the midline of the  lower abdomen extending just a few centimeters short of her umbilicus.  This  is slightly tender.  There is no rebound noted.  I do not detect any ascites  or other masses.  PELVIC:  EG//BUS, vagina, bladder, urethra are normal.  Cervix and uterus is  surgically absent.  The mass is palpable  in the upper anterior abdomen.  The  cul-de-sac seems free of disease.  There is no nodularity.  Rectovaginal  exam confirms.   LABORATORY DATA:  The patient's CT scan is reviewed with the above-noted  findings.   IMPRESSION:  A complex pelvic mass and elevated CA-125 in a menopausal  patient.  I believe this most likely represents ovarian cancer and had a  candid discussion with the patient and her husband and two daughters  regarding these findings.   RECOMMENDATIONS:  I recommend the patient undergo exploratory laparotomy,  resection of the mass with intraoperative frozen section to dictate our  subsequent surgical management.  The extensive surgical staging including  omentectomy, pelvic and periaortic lymphadenectomy and multiple peritoneal  biopsies were outlined to the patient and her family.  They are aware that  surgical debulking which might include colon or small bowel surgery would  also be considered as necessary.  All of the patient's questions are  answered, and she wishes to proceed with surgery which will be scheduled for  next week in conjunction with Laqueta Linden, M.D.   The risks of surgery including hemorrhage, infection, injury to  adjacent  viscera, thromboembolic complications, and anesthetic risks were outlined in  detail.  The patient had a number of questions regarding ovarian cancer and  also a number of questions regarding chemotherapy.  Some of those questions  are answered briefly, and we will readdress them in detail if need be in the  future.   All of the questions are answered for today, and we will go ahead with  preoperative screening and planning.                                               De Blanch, M.D.    DC/MEDQ  D:  10/03/2002  T:  10/03/2002  Job:  [id number]   cc:   Laqueta Linden, M.D.  [address]  [place]  [place] [address]  Fax: [phone]   Geoffry Paradise, M.D.  [address]  [place]  [place] [address]  Fax: [phone]   Rose Phi. Myna Hidalgo, M.D.  [address] [facility] [place]  [place], [place] [address]  Fax: [phone]   Telford Nab, R.N.  [address] [place], [place] [address]  Fax: [phone]

## 2011-02-19 NOTE — Consult Note (Signed)
NAME:  LEONE, MOBLEY                            ACCOUNT NO.:  [id number]   MEDICAL RECORD NO.:  [id number]                   PATIENT TYPE:  OUT   LOCATION:  GYN                                  [facility]ACILITY:  [facility]   PHYSICIAN:  De Blanch, M.D.         DATE OF BIRTH:  1931-06-26   DATE OF CONSULTATION:  04/24/2003  DATE OF DISCHARGE:                                   CONSULTATION   HISTORY OF PRESENT ILLNESS:  A 77 year old white female returns for  continuing follow-up of stage IC grade 2 ovarian cancer.   INTERVAL HISTORY:  Since our last visit the patient has completed  chemotherapy receiving three cycles of Taxotere and carboplatin.  The last  cycle was administered on December 21, 2002.  Prior to surgery her CA-125 value  was 315 but by the time of second cycle of chemotherapy was 30 and  subsequently has been between 17-18.  Most recently, her CA-125 value on  April 22, 2003 was 18.9.   The patient reports that she has done well.  She denies any GI or GU  symptoms.  Has no pelvic pain, pressure, vaginal bleeding, or discharge.  Her only concern is that she seems somewhat tired requiring a nap every day.   FAMILY HISTORY:  Reviewed and unchanged, although she thinks that she may  have had a grandmother with an breast cancer late in life.   SOCIAL HISTORY:  Reviewed and unchanged.   REVIEW OF SYSTEMS:  Unchanged except as noted above.   PHYSICAL EXAMINATION:  VITAL SIGNS:  Weight 144 pounds, blood pressure  136/76.  GENERAL:  The patient is a healthy, elderly white female in no acute  distress.  HEENT:  Resolving alopecia.  NECK:  Supple without thyromegaly.  LYMPH:  There is no supraclavicular or inguinal adenopathy.  ABDOMEN:  Soft, nontender.  No mass, organomegaly, ascites, or hernias  noted.  Her incision is well healed.  PELVIC:  EGBUS, vagina, bladder, urethra are normal, but atrophic.  The cuff  is well healed.  No lesions are noted.  Cervix and uterus are  surgically  absent.  Adnexa without masses or nodularity.  Rectovaginal examination  confirms.   IMPRESSION:  Stage IC grade 2 ovarian cancer status post resection staging  and three cycles of adjuvant carboplatin and Taxotere chemotherapy.  She is  doing well and remains free of disease.  It is noted her recent CA-125 value  (April 22, 2003) was 18.9 units/mL (stable).   PLAN:  The patient will return to see Rose Phi. Ennever, M.D. in three months  and return to see [place] in six months.                                               De Blanch, M.D.  DC/MEDQ  D:  04/24/2003  T:  04/24/2003  Job:  [id number]   cc:   Laqueta Linden, M.D.  [address]  [place]  [place] [address]  Fax: [phone]   Rose Phi. Myna Hidalgo, M.D.  [address] [facility]  [place], [place] [address]  Fax: [phone]   Geoffry Paradise, M.D.  [address]  [place]  [place] [address]  Fax: [phone]   Telford Nab, R.N.  [address]  [place], [place] [address]

## 2011-02-19 NOTE — Consult Note (Signed)
NAME:  [name], [name]                            ACCOUNT NO.:  [id number]   MEDICAL RECORD NO.:  [id number]                   PATIENT TYPE:  OUT   LOCATION:  GYN                                  FACILITY:  [facility]   PHYSICIAN:  De Blanch, M.D.         DATE OF BIRTH:  20-Feb-1931   DATE OF CONSULTATION:  04/14/2004  DATE OF DISCHARGE:                                   CONSULTATION   77 year-old white female returns to continue to be followed up as  stage IC, grade 2 ovarian cancer.   INTERVAL HISTORY:  Since her last visit she has done well, she denies any GI  or GU symptoms, she does feel somewhat fatigued.  She has no abdominal  bloating, distention or any other systemic systems.   HISTORY OF PRESENT ILLNESS:  Patient underwent surgical staging January,  2004.  She was found to have a stage IC, grade 2, ovarian cancer and  subsequently received three cycles of carboplatin and Taxotere chemotherapy.  She has done well since then.  CA-125 values have been in the high teens and  low 20s.   PAST MEDICAL HISTORY:  Hypertension.   DRUG ALLERGIES:  NONE.   CURRENT MEDICATIONS:  Hyzaar, allopurinol, Fosamax.   FAMILY HISTORY AND SOCIAL HISTORY:  Reviewed and unchanged from previous  notations.   REVIEW OF SYSTEMS:  Negative except as noted above.   PHYSICAL EXAMINATION:  Weight 156 pounds, blood pressure 150/70.  GENERAL:  The patient is a healthy white female in no acute distress.  HEENT:  Negative.  NECK:  Supple without thyromegaly.  There is no supraclavicular or inguinal  adenopathy.  ABDOMEN:  Soft, nontender.  No mass, organomegaly, ascites or hernias are  noted.  PELVIC EXAM:  EG/BUS, vagina, bladder, urethra are normal.  BIMANUAL AND RECTOVAGINAL EXAM:  Reveal no masses, induration or nodularity.   IMPRESSION:  Stage IC, grade 2, ovarian cancer.   The patient's CA-125 value is 22.9 (stage I).   PLAN:  The patient will return to see Dr. Myna Hidalgo in 3  months and return to  see [place] in January, 2006.                                               De Blanch, M.D.    DC/MEDQ  D:  04/15/2004  T:  04/15/2004  Job:  [id number]   cc:   Rose Phi. Myna Hidalgo, M.D.  [address] [facility]  [place], [place] [address]  Fax: [phone]   Laqueta Linden, M.D.  [address]  [place]  [place] [address]  Fax: [phone]   Telford Nab, R.N.  [phone] [address]  [place], [place] [address]

## 2011-02-19 NOTE — Consult Note (Signed)
[name], [name] NO.:  [id number]   MEDICAL RECORD NO.:  [id number]          PATIENT TYPE:  OUT   LOCATION:  GYN                          FACILITY:  [facility]   PHYSICIAN:  De Blanch, M.D.DATE OF BIRTH:  01-12-1931   DATE OF CONSULTATION:  DATE OF DISCHARGE:                                 CONSULTATION   CHIEF COMPLAINT:  Ovarian cancer.   INTERVAL HISTORY:  The patient returns today for continuing follow-up  having first stage I C ovarian cancer. Since her last visit she has done  well. She continues to be monitored for her renal failure, but has not  required dialysis to date. She denies any GI or GU symptoms. Denies any  abdominal pain or pressure, and overall her functional status seems to  be good.   HISTORY OF PRESENT ILLNESS:  The patient underwent initial surgical  resection and staging in January 2004. She is found to have a stage I C  grade 2 ovarian cancer. She was subsequently treated with three cycles  of carboplatin and Taxotere. She has been followed since then with no  evidence of recurrent disease. She had a recent CA-125 on January 10  that was 21 units per mL. Previously in July it was 21 units per mL as  well.   PAST MEDICAL HISTORY:  Chronic renal failure, hypertension.   DRUG ALLERGIES:  None.   CURRENT MEDICATIONS:  Hyzaar, allopurinol, Fosamax.   FAMILY HISTORY:  Negative for gynecologic breast or colon cancer.   SOCIAL HISTORY:  The patient is married. She does not smoke.   REVIEW OF SYSTEMS:  A 10-point comprehensive Review of Systems negative  except as noted above.   PHYSICAL EXAMINATION:  VITAL SIGNS:  Weight 157 pounds, blood pressure  120/70, pulse 80, respiratory rate 20.  GENERAL:  The patient is a healthy white female in no acute distress.  HEENT:  Negative.  NECK:  Supple without thyromegaly. There is no supraclavicular or  inguinal adenopathy.  ABDOMEN:  Soft, nontender. No mass, organomegaly,  ascites  or hernias noted.  PELVIC:  BUS, vagina, bladder, urethra are normal. Cervix, uterus are  surgically absent. Adnexa without masses. Rectovaginal exam confirms.   IMPRESSION:  Stage I C ovarian cancer. No evidence of recurrent disease.  Her exam is normal as is her CA-125. She will return to see Dr. Arlan Organ in six months, and return to see [place] in one year.      De Blanch, M.D.  Electronically Signed     DC/MEDQ  D:  10/20/2006  T:  10/20/2006  Job:  [id number]   cc:   Wilber Bihari. Caryn Section, M.D.  Fax: [phone]   M. Leda Quail, MD  Fax: [phone]   Geoffry Paradise, M.D.  Fax: [phone]   Rose Phi. Myna Hidalgo, M.D.  Fax: [phone]

## 2011-02-19 NOTE — Consult Note (Signed)
NAME:  [name], SUPAK                            ACCOUNT NO.:  [id number]   MEDICAL RECORD NO.:  [id number]                   PATIENT TYPE:  OUT   LOCATION:  GYN                                  FACILITY:  [facility]   PHYSICIAN:  De Blanch, M.D.         DATE OF BIRTH:  09/03/1931   DATE OF CONSULTATION:  10/16/2003  DATE OF DISCHARGE:                                   CONSULTATION   REASON FOR CONSULTATION:  A 77 year old white female returns for follow-up  of a stage Ic grade 2 ovarian cancer.   INTERVAL HISTORY:  Since her last visit she has done well.  Her only  complaint is that her stamina is not as good as she would like.  She has no  GI or GU symptoms and has no pelvic pain, pressure, vaginal bleeding, or  discharge.  She has no sequelae from her prior chemotherapy.   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  She subsequently received  three cycles of carboplatin and Taxotere chemotherapy under the direction of  Dr. Myna Hidalgo.  The last cycle was administered March 2004.  Preoperatively  her CA125 value was 315 but fell rapidly.   PAST MEDICAL HISTORY:  Hypertension.   DRUG ALLERGIES:  None.   CURRENT MEDICATIONS:  Hyzaar, allopurinol, Fosamax.   FAMILY HISTORY AND SOCIAL HISTORY:  Reviewed and unchanged.   REVIEW OF SYSTEMS:  Negative except as noted above, predominately that of  fatigue.   PHYSICAL EXAMINATION:  VITAL SIGNS:  Weight 150 pounds.  GENERAL:  The patient is a healthy white female in no acute distress.  ABDOMEN:  Soft, nontender.  No mass, organomegaly, ascites, or hernias  noted.  PELVIC:  EG/BUS, vagina, bladder, urethra are normal.  Bimanual and  rectovaginal exam revealed no masses, induration, or nodularity.   LABORATORY WORK:  The patient's CA125 was repeated on January 10 and was 19  units/mL  (previously it was 21 units/mL in October 2004).   IMPRESSION:  Stage Ic grade 2 ovarian cancer, no evidence of recurrent  disease.   PLAN:  The patient will return to see Dr. Arlan Organ in 3 months and  return to see [place] in 6 months.  Will continue to monitor CA125s every 3  months as well.                                               De Blanch, M.D.    DC/MEDQ  D:  10/16/2003  T:  10/16/2003  Job:  [id number]   cc:   Rose Phi. Myna Hidalgo, M.D.  [address] [facility]  [place], [place] [address]  Fax: [phone]   Laqueta Linden, M.D.  [address]  [place]  [place] [address]  Fax: [phone]   Telford Nab, R.N.  [phone]  [place], [place] [address]

## 2011-02-19 NOTE — Consult Note (Signed)
[name], LARMON NO.:  [id number]   MEDICAL RECORD NO.:  [id number]          PATIENT TYPE:  OUT   LOCATION:  GYN                          FACILITY:  [facility]   PHYSICIAN:  De Blanch, M.D.DATE OF BIRTH:  1931/02/15   DATE OF CONSULTATION:  11/02/2005  DATE OF DISCHARGE:                                   CONSULTATION   HISTORY OF PRESENT ILLNESS:  This 77 year old white female returns for  continuing follow up of ovarian cancer.   INTERVAL HISTORY:  Since her last visit, the patient has had no gynecologic  symptoms.  She denies any GI/GU symptoms.  However, it is noted that she has  developed chronic renal failure.  She reports that her creatinine is running  2.9, and at this juncture, has not entered a dialysis program.  Overall, she  feels quite well.   HISTORY OF PRESENT ILLNESS:  The patient underwent initial surgical  resection and staging of ovarian cancer January 2004.  She was found to have  a stage IC grade II ovarian cancer.  She was subsequently treated with three  cycles of carboplatin and Taxotere.  She has been followed since that time  with no evidence of recurrent disease.  Her CA-125 values have been running  in the range of 19.   PAST MEDICAL HISTORY:  1.  Hypertension.  2.  Chronic renal failure.   ALLERGIES:  None.   MEDICATIONS:  1.  Hyzaar.  2.  Allopurinol.  3.  Fosamax.   FAMILY HISTORY:  Negative for gynecologic, breast or colon cancer.   SOCIAL HISTORY:  The patient is married.  She does not smoke.   REVIEW OF SYSTEMS:  A 10-point comprehensive review of systems is negative  except as noted above.   PHYSICAL EXAMINATION:  VITAL SIGNS:  Weight 158 pounds, blood pressure  130/70.  GENERAL APPEARANCE:  The patient is a healthy white female in no acute  distress.  HEENT:  Negative.  NECK:  Supple without thyromegaly.  There is no supraclavicular or inguinal  adenopathy.  ABDOMEN:  Soft, nontender.  No masses,  organomegaly, ascites or hernias  noted.  PELVIC:  KUBS, vagina and urethra are normal, but atrophic cervix and uterus  are surgically absent.  Adnexa without masses.  RECTOVAGINAL:  Confirms.  EXTREMITIES:  Lower extremities are without edema or varicosities.   LABORATORY DATA:  CA-125 from January 11 is reviewed and is 22 units per ML.  This is essentially stable from prior exam.   IMPRESSION:  Stage IC, grade II ovarian cancer January 2004.  No evidence of  recurrent disease.   PLAN:  The patient returns to see Dr. Myna Hidalgo in six months for a checkup  and return to see [place] in one year.      De Blanch, M.D.  Electronically Signed     DC/MEDQ  D:  11/02/2005  T:  11/03/2005  Job:  [id number]   cc:   Rose Phi. Myna Hidalgo, M.D.  Fax: [phone]   Telford Nab, R.N.  [address]  [place], [place] [address]  Laqueta Linden, M.D.  Fax: [phone]

## 2011-02-19 NOTE — Consult Note (Signed)
   NAME:  [name], [name]                            ACCOUNT NO.:  [id number]   MEDICAL RECORD NO.:  [id number]                   PATIENT TYPE:  OUT   LOCATION:  GYN                                  FACILITY:  [facility]   PHYSICIAN:  De Blanch, M.D.         DATE OF BIRTH:  [birth year]   DATE OF CONSULTATION:  10/17/2002  DATE OF DISCHARGE:                                   CONSULTATION   A 77 year old white female returns now one week following surgical staging  for an ovarian cancer.  Final pathology showed she had stage IC grade 2  ovarian cancer predominantly endometrioid type.  The patient had an  uncomplicated postoperative course.   PHYSICAL EXAMINATION:  VITAL SIGNS:  Weight 145 pounds.  ABDOMEN:  Soft, nontender.  No mass, organomegaly, ascites, or hernias are  noted.  Midline incision is healing well.  Staples are removed and Steri-  Strips are applied.   IMPRESSION:  Stage IC grade 2 ovarian cancer.   RECOMMENDATIONS:  I had a lengthy discussion with the patient and daughter  and husband regarding her surgical staging, pathology and recommended that  she receive three cycles of carboplatin and Taxol chemotherapy.  We  discussed the side effects of chemotherapy and expected outcome.  She is  scheduled to see Rose Phi. Ennever, M.D. on October 26, 2002 at 10:30 a.m. to  discuss further with him chemotherapy and details regarding side effects and  then plan for initiation of therapy.  The patient strongly desires to  complete chemotherapy prior to a planned trip in mid April.   I will have the patient return to see me for a six week postoperative check-  up and then coordinate follow-up with Rose Phi. Myna Hidalgo, M.D.                                               De Blanch, M.D.    DC/MEDQ  D:  10/17/2002  T:  10/17/2002  Job:  [id number]   cc:   Laqueta Linden, M.D.  [address]  [place]  [place] [address]  Fax: [phone]   Rose Phi. Myna Hidalgo,  M.D.  [address] [facility]  [place], [place] [address]  Fax: [phone]   Geoffry Paradise, M.D.  [address]  [place]  [place] [address]  Fax: [phone]   Telford Nab, R.N.  [address] [place], [place] [address]  Fax: 1

## 2011-02-19 NOTE — Op Note (Signed)
NAME:  [name], [name]                            ACCOUNT NO.:  [id number]   MEDICAL RECORD NO.:  [id number]                   PATIENT TYPE:  INP   LOCATION:  0008                                 FACILITY:  [facility]   PHYSICIAN:  De Blanch, M.D.         DATE OF BIRTH:  09-Aug-1931   DATE OF PROCEDURE:  10/09/2002  DATE OF DISCHARGE:                                 OPERATIVE REPORT   PREOPERATIVE DIAGNOSIS:  Complex pelvic mass with elevated CA125.   POSTOPERATIVE DIAGNOSIS:  Ovarian carcinoma (stage pending final pathology).   OPERATION PERFORMED:  Exploratory laparotomy, bilateral salpingo-  oophorectomy, ureterolysis, omentectomy, pelvic infra-aortic  lymphadenectomy, multiple peritoneal biopsies.   SURGEON:  De Blanch, M.D.   ASSISTANT:  1. Laqueta Linden, M.D.  2. Telford Nab, R.N.   ANESTHESIA:  General orotracheal anesthesia.   ESTIMATED BLOOD LOSS:  1000 cc.   OPERATIVE FINDINGS:  At time of exploratory laparotomy the patient had  approximately 16 cm solid and cystic mass arising from and replacing the  left ovary.  This was densely adherent and involving the sigmoid mesocolon  and left pelvic sidewall.  There was retroperitoneal fibrosis on the left  side necessitating ureterolysis to protect the ureter.   Exploration of the upper abdomen including the diaphragms, liver capsule,  spleen, stomach and omentum were normal.  The small bowel was run from the  ligament of Treitz to the cecum with no evidence of metastatic disease on  the serosa or mesentery.  The appendix was surgically absent.  The remainder  of the colon was normal except for the dense adherence to the sigmoid  mesentery noted.  The right tube and ovary appeared normal.  The patient had  previously had a hysterectomy.  There were no enlarged pelvic or periaortic  lymph nodes.  At the completion of the surgical procedure, all gross  residual disease had been resected.   DESCRIPTION OF PROCEDURE:  The patient was brought to the operating room and  after satisfactory attainment of general anesthesia, placed in modified  lithotomy position in Allen's stirrups.  The anterior abdominal wall,  perineum and vagina were prepped.  A Foley catheter was inserted.  The  patient was draped, the abdomen was entered through a midline incision which  was extended approximately 10 cm above the umbilicus.  Peritoneal washings  were obtained.  The pelvis and abdomen were explored with the above-noted  findings.  The Bookwalter retractor was positioned.  In order to mobilize  the densely adherent left ovarian mass, the peritoneal incision was made in  the left pericolic gutter and left pelvic sidewall.  Dissection exposed the  psoas muscle, common and external iliac artery and ureter.  The ovarian  vessels were skeletonized, clamped, cut and doubly free-tied.  The dense  adherence to the sigmoid mesentery was then incised using cautery.  Hemostasis was occasionally needed with suture ligatures.  In the course  of  this dissection of this densely adherent ovary, cyst fluid leaked from the  ovarian mass.  The ureter was further dissected free from its attachments to  the side of the mass and the mesentery of the colon was further incised  mobilizing the mass away from the colon with the capsule of the mass  completely freed from the colonic mesentery.  Ultimately, the mass was fully  dissected and submitted to pathology for frozen section revealing  adenocarcinoma.  Hemostasis in the pelvis was then achieved with Hemoclips  and cautery.  The left side of the pelvis was packed with direct pressure.  The right pelvic peritoneum was incised.  The ovarian vessels skeletonized,  clamped, cut free-tied and suture ligated.  The ureter was identified  throughout the dissection.  Peritoneum beneath the tube and ovary was then  incised with cautery.  There was a solid small cystic  structure.  The right  pelvic sidewall which appeared to be adjacent to the fallopian tube such as  a cyst of Morgagni.  This was submitted as a separate specimen.   Peritoneal biopsies from the anterior and posterior cul-de-sac, lateral  pelvic side walls and pericolic gutters were then obtained.  The Bookwalter  retractor was repositioned and the omentum brought into the surgical field.  An infracolic omentectomy was then performed mobilizing the omentum from the  transverse colon.  Vascular pedicles  were clamped, divided and free tied  using 2-0 Vicryl.  The omentum was divided into multiple sections and  submitted to pathology.   Attention was then turned to pelvic lymphadenectomy.  Pelvic lymph nodes  from the internal iliac artery and vein, internal iliac artery and obturator  space were removed from the left side of the pelvis.  Hemostasis was again  achieved with Hemoclips and cautery.  There were no grossly enlarged nodes.  There was some thickening in the left parametrium.  This was excised and  submitted as a separate specimen.   The retractor was again repositioned.  The left pericolic gutter was incised  and the left colon mobilized medially along with the left ureter.  Thus, the  left common iliac artery and aorta were exposed on the left side.  Periaortic node dissection was then carried out to approximately 6 cm above  the inferior mesentery artery.  Hemostasis again achieved with hemoclips.   The left colon mesentery was then pushed to the left.  An incision was made  along the right common iliac artery and along the right side of the aorta.  The right ureter was identified and mobilized laterally.  The lymph nodes  overlying the vena cava and right side of the aorta were then excised to  approximately 6 to 7 cm above the origin of the inferior mesenteric artery.  Hemostasis again was achieved with cautery and Hemoclips.  In the course of the dissection, the  retroperitoneal portion of the duodenum was mobilized  cephalad to gain better exposure.   Hemostasis being achieved in the periaortic region, attention was turned  back to the pelvis.  The pelvis was irrigated and hemostasis was  ascertained.  A scrape of the diaphragm was obtained and submitted to  cytopathology.   The packs and retractors were removed, the abdomen and pelvis irrigated with  saline.  The anterior abdominal wall was then closed in a running mass  closure using #1 PDS.  The subcutaneous tissue was irrigated.  Hemostasis  was achieved with cautery and the skin closed  with skin staples.  A dressing  was applied.  The patient was awakened from anesthesia and taken to the  recovery room in satisfactory condition.  Sponge, needle and instrument  counts were correct times two.                                                 De Blanch, M.D.    DC/MEDQ  D:  10/09/2002  T:  10/09/2002  Job:  [id number]   cc:   Laqueta Linden, M.D.  [address]  [place]  [place] [address]  Fax: [phone]   Telford Nab, R.N.  [address] [place], [place] [address]  Fax: 1   Geoffry Paradise, M.D.  [address]  [place]  [place] [address]  Fax: [phone]   Rose Phi. Myna Hidalgo, M.D.  [address] [facility]  [place], [place] [address]  Fax: [phone]

## 2011-02-19 NOTE — Consult Note (Signed)
NAMEMARYLYNN, [name] NO.:  [id number]   MEDICAL RECORD NO.:  [id number]          PATIENT TYPE:  OUT   LOCATION:  GYN                          FACILITY[facility]  [facility]   PHYSICIAN:  De Blanch, M.D.DATE OF BIRTH:  03/30/1931   DATE OF CONSULTATION:  10/20/2004  DATE OF DISCHARGE:                                   CONSULTATION   A 77 year old white female, returns for continuing follow-up of a stage I-C,  grade 2 ovarian cancer.   INTERVAL HISTORY:  Since her last visit, the patient has done well.  She has  had some problems with chronic renal insufficiency, but she denies any  abdominal symptoms and has no GI or GU symptoms specifically.  She has no  pelvic pain, pressure, vaginal pain or discharge.   HISTORY OF PRESENT ILLNESS:  The patient underwent initial surgical  resection and staging January 2004.  She was found to have a stage I-C,  grade 2 ovarian cancer and received three cycles of carboplatin and Taxotere  chemotherapy.  She has done well since then.   PAST MEDICAL HISTORY:  Medical illnesses:  Hypertension, chronic renal  failure.   DRUG ALLERGIES:  None.   CURRENT MEDICATIONS:  Hyzaar, allopurinol, Fosamax.   Family history and social history are reviewed and unchanged from previous  notations.   REVIEW OF SYSTEMS:  Negative except as noted above.   PHYSICAL EXAMINATION:  VITAL SIGNS:  Weight 155 pounds.  Blood pressure  130/80.  GENERAL:  The patient is a healthy white female in no acute distress.  HEENT:  Negative.  NECK:  Supple without thyromegaly.  LYMPHATIC:  There is no supraclavicular or inguinal adenopathy.  ABDOMEN:  Soft, nontender.  No mass, organomegaly, ascites or hernias are  noted.  PELVIC:  EG/BUS, vagina, bladder and urethra are normal.  Bimanual and  rectovaginal exam reveal no masses, induration or nodularity.   IMPRESSION:  Stage I-C, grade 2 ovarian cancer, no evidence of recurrent  disease.  A CA125 was  obtained on January 6, which is 18 units/mL.   The patient will return to see Dr. Myna Hidalgo in three months and return to see  [place] in six months.      DC/MEDQ  D:  10/20/2004  T:  10/20/2004  Job:  [id number]   cc:   Rose Phi. Myna Hidalgo, M.D.  [address] [facility]  [place], [place] [address]  Fax: [phone]   Telford Nab, R.N.  [address]  [place], [place] [address]   Laqueta Linden, M.D.  [address]  [place]  [place] [address]  Fax: [phone]

## 2011-02-19 NOTE — Consult Note (Signed)
NAME:  [name], [name]                            ACCOUNT NO.:  [id number]   MEDICAL RECORD NO.:  [id number]                   PATIENT TYPE:  OUT   LOCATION:  GYN                                  [facility]ACILITY:  [facility]   PHYSICIAN:  De Blanch, M.D.         DATE OF BIRTH:  May 28, 1931   DATE OF CONSULTATION:  11/28/2002  DATE OF DISCHARGE:                                   CONSULTATION   A 77 year old white female returns for continuing follow-up of a stage IC  grade 2 ovarian cancer.  She underwent initial surgical staging and  resection on October 09, 2002.  Subsequently, she had an uncomplicated  postoperative course and has now had two cycles of carboplatin and Taxol  chemotherapy under the care of Peter R. Myna Hidalgo, M.D.  Preoperatively her CA-  125 was 315 units/mL and prior to her first cycle of chemotherapy was 141  units/mL.  Prior to her second cycle of chemotherapy her CA-125 had fallen  to 30 units/mL.  The patient seems to be tolerating the chemotherapy well,  although she is having considerable amount of long bone discomfort secondary  to the Neulasta injections.  She denies any GI or GU symptoms.  She has no  pelvic pain, pressure, vaginal bleeding or discharge.  Her functional status  is improving.  She has been working in her [employer].   Review of her chemotherapy records show no significant thrombocytopenia or  neutropenia.  It is noted that her hemoglobin is now 9.9 units/mL and she  has been given one dose of Aranesp.   Overall from a surgical point of view she seems to be recovering nicely.   FAMILY HISTORY:  Reviewed and unchanged.   SOCIAL HISTORY:  Reviewed and unchanged.  The patient comes accompanied by  her husband today.   CURRENT MEDICATIONS:  1. Hyzaar.  2. Allopurinol.  3. Fosamax.   PAST MEDICAL HISTORY:  Hypertension.   ALLERGIES:  None.   REVIEW OF SYSTEMS:  No cardiovascular, pulmonary, GI, GU, neurologic, or  musculoskeletal symptoms.   PHYSICAL EXAMINATION:  VITAL SIGNS:  Weight 142 pounds, blood pressure  110/62.  GENERAL:  The patient is a healthy white female in no acute distress.  HEENT:  Negative.  NECK:  Supple without thyromegaly.  LYMPH:  There is no supraclavicular or inguinal adenopathy.  ABDOMEN:  Soft, nontender.  No mass, organomegaly, ascites, or hernias are  noted.  Midline incision is well healed.  PELVIC:  EGBUS, vagina, bladder, urethra are normal.  Vaginal cuff was well  healed.  No lesions are noted.  Bimanual and rectovaginal examination reveal  no masses, induration, nodularity.   IMPRESSION:  Stage IC grade 2 ovarian carcinoma.  Having had a good  postoperative recovery, the patient is given the okay to return to full  levels of activity.   PLAN:  The patient will continue to receive three cycles of chemotherapy.  I  would recommend she have a follow-up CT scan approximately one month  following completion of her last cycle of chemotherapy.  Thereafter, we will  arrange for follow-up visits to alternate between [place] R. Myna Hidalgo, M.D. and  myself every three months.  We will obtain CA-125 at each visit, but would  not plan any further CT scans unless there was clinical suspicion of a  problem.                                               De Blanch, M.D.    DC/MEDQ  D:  11/28/2002  T:  11/28/2002  Job:  [id number]   cc:   Laqueta Linden, M.D.  [address]  [place]  [place] [address]  Fax: [phone]   Geoffry Paradise, M.D.  [address]  [place]  [place] [address]  Fax: [phone]   Rose Phi. Myna Hidalgo, M.D.  [address] [facility]  [place], [place] [address]  Fax: [phone]   Telford Nab, R.N.

## 2011-02-19 NOTE — Discharge Summary (Signed)
NAME:  [name], [name]                            ACCOUNT NO.:  [id number]   MEDICAL RECORD NO.:  [id number]                   PATIENT TYPE:  INP   LOCATION:  0463                                 FACILITY:  [facility]   PHYSICIAN:  Laqueta Linden, M.D.                 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   SECONDARY DIAGNOSES:  1. Hypertension.  2. Osteoporosis.   PROCEDURES:  Exploratory laparotomy with bilateral salpingo-oophorectomy,  uterolysis, omentectomy, pelvic and periaortic lymphadenectomy, and multiple  peritoneal biopsies.   TRANSFUSIONS:  Two units of packed red blood cells intraoperatively.   COMPLICATIONS:  None.   CONSULTATIONS:  Geoffry Paradise, M.D., primary care physician.   HISTORY OF PRESENT ILLNESS:  The patient is a 77 year old menopausal female,  status post vaginal hysterectomy, who presented for routine examination and  was noted to have a large pelvic mass.  Pelvic ultrasound confirmed the  presence of a complex ovarian mass arising from the left ovary suspicious  for malignancy.  The CA125 was elevated at 315.  She was seen in  consultation by Dr. De Blanch, and surgery was arranged.  Please  see dictated history and physical for full details of the history of present  illness, past history, and examination on admission.   HOSPITAL COURSE:  The patient was admitted for same-day surgery and  underwent the above-named procedure under general endotracheal anesthesia  without complications.  She had undergone a mechanical bowel prep at home  the day prior to the surgery.  Postoperatively she did well.  She maintained  excellent urinary output.  She had some initial nausea which was controlled  by antiemetics.  Her postoperative  hemoglobin was stable at 11.5 with  hematocrit of 33.2.  Electrolytes were stable.  BUN and creatinine remained  stable with a BUN of 29 and a creatinine of 1.8, consistent with the  patient's history of mild renal insufficiency.  She did have some decrease  in potassium from a preoperative level of 10.1 to 7.7, felt to be  dilutional, and this will be followed after discharge by her primary care  physician.   She gradually advanced her diet, increased her ambulation, and had flatus  and passage of a small bowel movement prior to discharge.  She advanced her  diet to a general diet and was somewhat gassy-feeling and a little distended  on the day of discharge.  She will be given a Dulcolax suppository and  simethicone prior to discharge.  Her abdomen remained soft, with normal  bowel sounds, and the incision was healing well.   She is to be discharged home on postoperative day #3, in improved condition.  Instructions were  reviewed in detail with the patient and her husband.  She  is to use Advil or Aleve or Tylenol as needed, as well as her routine  medications.  She was given a prescription for Percocet 5/325, dispense 20,  1-2 q.4-6h. p.r.n. pain, with no refills.  She was told to take over-the-  counter milk of magnesia, docusate, and simethicone as needed.  She is to  follow up in the office with Dr. Stanford Breed on October 17, 2002, at 0900  for staple removal and treatment planning.  Dr. Myna Hidalgo will be contacted  regarding planning of subsequent chemotherapy.  The patient was discharged  home in improved condition on postoperative day #3.                                               Laqueta Linden, M.D.    LKS/MEDQ  D:  10/12/2002  T:  10/13/2002  Job:  [id number]   cc:   Geoffry Paradise, M.D.  [address]  [place]  [place] [address]  Fax: [phone]   Rose Phi. Myna Hidalgo, M.D.  [address] [facility]  [place], [place] [address]  Fax: [phone]   Telford Nab, R.N.  [address] [place], [place] [address]  Fax: 1   De Blanch, M.D.  [address] [facility]  [place]  [place] [address]  Fax: 1

## 2011-02-19 NOTE — Op Note (Signed)
NAME:  [name], [name]                            ACCOUNT NO.:  [id number]   MEDICAL RECORD NO.:  [id number]                   PATIENT TYPE:  OIB   LOCATION:  2899                                 FACILITY:  [facility]   PHYSICIAN:  Larina Earthly, M.D.                 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   POSTOPERATIVE DIAGNOSIS:  No critical renal stenosis.   OPERATION PERFORMED:  CO2 renal arteriogram.   SURGEON:  Larina Earthly, M.D.   ANESTHESIA:  1% lidocaine local.   COMPLICATIONS:  None.   DISPOSITION:  To holding area stable.   INDICATIONS FOR PROCEDURE:  The patient has baseline renal dysfunction with  the creatinine in the 2.8 range.  She had MRA which suggested critical right  renal artery stenosis.  It was recommended that she undergo CO2  arteriography for further evaluation to determine if she indeed had critical  renal artery stenosis.   DESCRIPTION OF PROCEDURE:  The patient was taken to the peripheral vascular  cath lab and placed in supine position where the area of the right groin was  prepped and draped in the usual sterile fashion.  Using local anesthesia and  single wall puncture, the right common femoral artery was entered and the  guidewire was passed up to the level of the suprarenal aorta.  A 5 French  sheath was passed over the guidewire and the pigtail catheter was positioned  at the level of the mesenteric vessels.  CO2 arteriogram revealed widely  patent celiac and superior mesenteric arteries.  The patient had a widely  patent left renal artery.  There was moderate bowel gas with reasonable  resolution with CO2.  The initial injection suggested moderate stenosis of  right renal artery.  The pigtail catheter was then withdrawn further  distally and repeat CO2 injection again showed moderate only  flow stenosis  in her right renal artery.  IMA catheter was used to selectively inject at  the right renal take off.  The patient tolerated the procedure without  immediate complication and was transferred to the holding area in stable  condition.                                               Larina Earthly, M.D.   TFE/MEDQ  D:  04/08/2004  T:  04/08/2004  Job:  [id number]   cc:   Geoffry Paradise, M.D.  [address]  [place]  [place] [address]  Fax: [phone]

## 2011-03-26 ENCOUNTER — Ambulatory Visit: Payer: Medicare Other | Admitting: Physical Therapy

## 2011-04-05 ENCOUNTER — Other Ambulatory Visit: Payer: Self-pay | Admitting: Hematology & Oncology

## 2011-04-05 ENCOUNTER — Encounter (HOSPITAL_BASED_OUTPATIENT_CLINIC_OR_DEPARTMENT_OTHER): Payer: Medicare Other | Admitting: Hematology & Oncology

## 2011-04-05 ENCOUNTER — Other Ambulatory Visit: Payer: Self-pay | Admitting: Family

## 2011-04-05 DIAGNOSIS — N189 Chronic kidney disease, unspecified: Secondary | ICD-10-CM

## 2011-04-05 DIAGNOSIS — D649 Anemia, unspecified: Secondary | ICD-10-CM

## 2011-04-05 DIAGNOSIS — Z8543 Personal history of malignant neoplasm of ovary: Secondary | ICD-10-CM

## 2011-04-05 LAB — CBC WITH DIFFERENTIAL (CANCER CENTER ONLY)
BASO#: 0 10*3/uL (ref 0.0–0.2)
EOS%: 4 % (ref 0.0–7.0)
HGB: 10.7 g/dL — ABNORMAL LOW (ref 11.6–15.9)
MCH: 33.2 pg (ref 26.0–34.0)
MCHC: 34.3 g/dL (ref 32.0–36.0)
MONO%: 7.4 % (ref 0.0–13.0)
NEUT#: 4.3 10*3/uL (ref 1.5–6.5)
RDW: 14.5 % (ref 11.1–15.7)

## 2011-04-05 LAB — FERRITIN: Ferritin: 645 ng/mL — ABNORMAL HIGH (ref 10–291)

## 2011-04-05 LAB — COMPREHENSIVE METABOLIC PANEL
Albumin: 4.1 g/dL (ref 3.5–5.2)
Alkaline Phosphatase: 66 U/L (ref 39–117)
Calcium: 9.6 mg/dL (ref 8.4–10.5)
Potassium: 4.9 mEq/L (ref 3.5–5.3)
Sodium: 142 mEq/L (ref 135–145)

## 2011-04-06 LAB — PHOSPHORUS: Phosphorus: 3.7 mg/dL (ref 2.3–4.6)

## 2011-04-06 LAB — PTH, INTACT AND CALCIUM: PTH: 117.2 pg/mL — ABNORMAL HIGH (ref 14.0–72.0)

## 2011-07-05 ENCOUNTER — Encounter (HOSPITAL_BASED_OUTPATIENT_CLINIC_OR_DEPARTMENT_OTHER): Payer: Medicare Other | Admitting: Oncology

## 2011-07-05 ENCOUNTER — Other Ambulatory Visit: Payer: Self-pay | Admitting: Hematology & Oncology

## 2011-07-05 DIAGNOSIS — N189 Chronic kidney disease, unspecified: Secondary | ICD-10-CM

## 2011-07-05 DIAGNOSIS — N039 Chronic nephritic syndrome with unspecified morphologic changes: Secondary | ICD-10-CM

## 2011-07-05 DIAGNOSIS — D649 Anemia, unspecified: Secondary | ICD-10-CM

## 2011-07-05 LAB — CBC WITH DIFFERENTIAL/PLATELET
Eosinophils Absolute: 0.3 10*3/uL (ref 0.0–0.5)
LYMPH%: 17.6 % (ref 14.0–49.7)
MCHC: 34.1 g/dL (ref 31.5–36.0)
MCV: 98.3 fL (ref 79.5–101.0)
MONO%: 4.5 % (ref 0.0–14.0)
NEUT#: 4.6 10*3/uL (ref 1.5–6.5)
NEUT%: 72.6 % (ref 38.4–76.8)
Platelets: 186 10*3/uL (ref 145–400)
RBC: 3.11 10*6/uL — ABNORMAL LOW (ref 3.70–5.45)

## 2011-08-31 ENCOUNTER — Telehealth: Payer: Self-pay | Admitting: Genetic Counselor

## 2011-10-05 DIAGNOSIS — C50919 Malignant neoplasm of unspecified site of unspecified female breast: Secondary | ICD-10-CM

## 2011-10-05 HISTORY — DX: Malignant neoplasm of unspecified site of unspecified female breast: C50.919

## 2011-10-08 ENCOUNTER — Other Ambulatory Visit: Payer: Self-pay | Admitting: Hematology & Oncology

## 2011-10-08 ENCOUNTER — Other Ambulatory Visit: Payer: Self-pay | Admitting: *Deleted

## 2011-10-08 ENCOUNTER — Encounter: Payer: Self-pay | Admitting: Hematology & Oncology

## 2011-10-08 ENCOUNTER — Telehealth: Payer: Self-pay | Admitting: Hematology & Oncology

## 2011-10-08 DIAGNOSIS — D631 Anemia in chronic kidney disease: Secondary | ICD-10-CM

## 2011-10-08 DIAGNOSIS — C569 Malignant neoplasm of unspecified ovary: Secondary | ICD-10-CM

## 2011-10-08 DIAGNOSIS — N189 Chronic kidney disease, unspecified: Secondary | ICD-10-CM

## 2011-10-11 ENCOUNTER — Ambulatory Visit: Payer: Medicare Other

## 2011-10-11 ENCOUNTER — Other Ambulatory Visit: Payer: Self-pay | Admitting: Hematology & Oncology

## 2011-10-11 ENCOUNTER — Other Ambulatory Visit (HOSPITAL_BASED_OUTPATIENT_CLINIC_OR_DEPARTMENT_OTHER): Payer: Medicare Other | Admitting: Lab

## 2011-10-11 VITALS — BP 189/94 | HR 82 | Temp 98.2°F

## 2011-10-11 DIAGNOSIS — N189 Chronic kidney disease, unspecified: Secondary | ICD-10-CM

## 2011-10-11 DIAGNOSIS — D631 Anemia in chronic kidney disease: Secondary | ICD-10-CM

## 2011-10-11 LAB — CBC WITH DIFFERENTIAL/PLATELET
BASO%: 0.1 % (ref 0.0–2.0)
EOS%: 5.4 % (ref 0.0–7.0)
LYMPH%: 17.7 % (ref 14.0–49.7)
MCH: 33.6 pg (ref 25.1–34.0)
MCHC: 34.6 g/dL (ref 31.5–36.0)
MCV: 97.3 fL (ref 79.5–101.0)
MONO#: 0.4 10*3/uL (ref 0.1–0.9)
MONO%: 5.8 % (ref 0.0–14.0)
Platelets: 190 10*3/uL (ref 145–400)
RBC: 3.22 10*6/uL — ABNORMAL LOW (ref 3.70–5.45)
WBC: 6.7 10*3/uL (ref 3.9–10.3)

## 2011-10-11 MED ORDER — DARBEPOETIN ALFA-POLYSORBATE 500 MCG/ML IJ SOLN
300.0000 ug | Freq: Once | INTRAMUSCULAR | Status: DC
  Filled 2011-10-11: qty 1

## 2011-10-15 ENCOUNTER — Ambulatory Visit (HOSPITAL_BASED_OUTPATIENT_CLINIC_OR_DEPARTMENT_OTHER): Payer: Medicare Other

## 2011-10-15 VITALS — BP 130/65 | HR 79 | Temp 98.5°F

## 2011-10-15 DIAGNOSIS — D649 Anemia, unspecified: Secondary | ICD-10-CM

## 2011-10-15 DIAGNOSIS — D631 Anemia in chronic kidney disease: Secondary | ICD-10-CM

## 2011-10-15 DIAGNOSIS — N289 Disorder of kidney and ureter, unspecified: Secondary | ICD-10-CM

## 2011-10-15 MED ORDER — DARBEPOETIN ALFA-POLYSORBATE 500 MCG/ML IJ SOLN
300.0000 ug | Freq: Once | INTRAMUSCULAR | Status: AC
  Administered 2011-10-15: 300 ug via SUBCUTANEOUS
  Filled 2011-10-15: qty 1

## 2011-11-02 NOTE — Telephone Encounter (Signed)
Did not see pt.  No need for encounrter note

## 2012-01-05 ENCOUNTER — Other Ambulatory Visit: Payer: Self-pay | Admitting: *Deleted

## 2012-01-10 ENCOUNTER — Ambulatory Visit (HOSPITAL_BASED_OUTPATIENT_CLINIC_OR_DEPARTMENT_OTHER): Payer: Medicare Other

## 2012-01-10 ENCOUNTER — Other Ambulatory Visit: Payer: Medicare Other | Admitting: Lab

## 2012-01-10 VITALS — BP 144/72 | HR 68 | Temp 97.3°F

## 2012-01-10 DIAGNOSIS — N189 Chronic kidney disease, unspecified: Secondary | ICD-10-CM

## 2012-01-10 DIAGNOSIS — N039 Chronic nephritic syndrome with unspecified morphologic changes: Secondary | ICD-10-CM

## 2012-01-10 DIAGNOSIS — D631 Anemia in chronic kidney disease: Secondary | ICD-10-CM

## 2012-01-10 LAB — CBC WITH DIFFERENTIAL/PLATELET
BASO%: 0.3 % (ref 0.0–2.0)
Basophils Absolute: 0 10*3/uL (ref 0.0–0.1)
HCT: 32.3 % — ABNORMAL LOW (ref 34.8–46.6)
HGB: 10.9 g/dL — ABNORMAL LOW (ref 11.6–15.9)
MCHC: 33.7 g/dL (ref 31.5–36.0)
MONO#: 0.4 10*3/uL (ref 0.1–0.9)
NEUT%: 70.5 % (ref 38.4–76.8)
WBC: 7.7 10*3/uL (ref 3.9–10.3)
lymph#: 1.4 10*3/uL (ref 0.9–3.3)

## 2012-01-10 MED ORDER — DARBEPOETIN ALFA-POLYSORBATE 300 MCG/0.6ML IJ SOLN
300.0000 ug | Freq: Once | INTRAMUSCULAR | Status: AC
  Administered 2012-01-10: 300 ug via SUBCUTANEOUS
  Filled 2012-01-10: qty 0.6

## 2012-02-08 ENCOUNTER — Other Ambulatory Visit: Payer: Self-pay | Admitting: Radiology

## 2012-02-08 HISTORY — PX: BREAST BIOPSY: SHX20

## 2012-02-10 ENCOUNTER — Other Ambulatory Visit: Payer: Self-pay | Admitting: *Deleted

## 2012-02-10 ENCOUNTER — Encounter: Payer: Self-pay | Admitting: *Deleted

## 2012-02-10 ENCOUNTER — Telehealth: Payer: Self-pay | Admitting: *Deleted

## 2012-02-10 DIAGNOSIS — C50519 Malignant neoplasm of lower-outer quadrant of unspecified female breast: Secondary | ICD-10-CM

## 2012-02-10 DIAGNOSIS — C50319 Malignant neoplasm of lower-inner quadrant of unspecified female breast: Secondary | ICD-10-CM | POA: Insufficient documentation

## 2012-02-10 NOTE — Telephone Encounter (Signed)
Confirmed [facility] for 02/16/12 at 1200 .  Instructions and contact information given.  

## 2012-02-10 NOTE — Progress Notes (Signed)
Dawn from [facility] called stating that anemia patient of Dr. Tama Gander is being referred to Breast cancer multidisciplinary clinic next week for evaluation of breast cancer.  Dr Myna Hidalgo informed of this.

## 2012-02-14 ENCOUNTER — Encounter (INDEPENDENT_AMBULATORY_CARE_PROVIDER_SITE_OTHER): Payer: Medicare Other | Admitting: General Surgery

## 2012-02-16 ENCOUNTER — Ambulatory Visit: Payer: Medicare Other | Attending: General Surgery | Admitting: Physical Therapy

## 2012-02-16 ENCOUNTER — Encounter: Payer: Self-pay | Admitting: *Deleted

## 2012-02-16 ENCOUNTER — Encounter (INDEPENDENT_AMBULATORY_CARE_PROVIDER_SITE_OTHER): Payer: Self-pay | Admitting: General Surgery

## 2012-02-16 ENCOUNTER — Encounter: Payer: Self-pay | Admitting: Specialist

## 2012-02-16 ENCOUNTER — Ambulatory Visit (HOSPITAL_BASED_OUTPATIENT_CLINIC_OR_DEPARTMENT_OTHER): Payer: Medicare Other | Admitting: General Surgery

## 2012-02-16 ENCOUNTER — Telehealth: Payer: Self-pay | Admitting: *Deleted

## 2012-02-16 ENCOUNTER — Ambulatory Visit
Admission: RE | Admit: 2012-02-16 | Discharge: 2012-02-16 | Disposition: A | Discharge status: Home / Self Care | Payer: Medicare Other | Source: Ambulatory Visit | Attending: Radiation Oncology | Admitting: Radiation Oncology

## 2012-02-16 ENCOUNTER — Ambulatory Visit: Payer: Medicare Other

## 2012-02-16 ENCOUNTER — Encounter: Payer: Self-pay | Admitting: Radiation Oncology

## 2012-02-16 ENCOUNTER — Other Ambulatory Visit (INDEPENDENT_AMBULATORY_CARE_PROVIDER_SITE_OTHER): Payer: Self-pay | Admitting: General Surgery

## 2012-02-16 ENCOUNTER — Ambulatory Visit (HOSPITAL_BASED_OUTPATIENT_CLINIC_OR_DEPARTMENT_OTHER): Payer: Medicare Other | Admitting: Oncology

## 2012-02-16 ENCOUNTER — Other Ambulatory Visit (HOSPITAL_BASED_OUTPATIENT_CLINIC_OR_DEPARTMENT_OTHER): Payer: Medicare Other | Admitting: Lab

## 2012-02-16 VITALS — BP 147/75 | HR 92 | Temp 98.2°F | Ht 69.0 in | Wt 149.2 lb

## 2012-02-16 DIAGNOSIS — M25519 Pain in unspecified shoulder: Secondary | ICD-10-CM | POA: Insufficient documentation

## 2012-02-16 DIAGNOSIS — IMO0001 Reserved for inherently not codable concepts without codable children: Secondary | ICD-10-CM | POA: Insufficient documentation

## 2012-02-16 DIAGNOSIS — M25619 Stiffness of unspecified shoulder, not elsewhere classified: Secondary | ICD-10-CM | POA: Insufficient documentation

## 2012-02-16 DIAGNOSIS — C50919 Malignant neoplasm of unspecified site of unspecified female breast: Secondary | ICD-10-CM

## 2012-02-16 DIAGNOSIS — C50519 Malignant neoplasm of lower-outer quadrant of unspecified female breast: Secondary | ICD-10-CM

## 2012-02-16 LAB — CBC WITH DIFFERENTIAL/PLATELET
BASO%: 0.4 % (ref 0.0–2.0)
EOS%: 3.3 % (ref 0.0–7.0)
Eosinophils Absolute: 0.2 10*3/uL (ref 0.0–0.5)
MCH: 33 pg (ref 25.1–34.0)
MCV: 98.6 fL (ref 79.5–101.0)
MONO%: 5.4 % (ref 0.0–14.0)
NEUT#: 4.4 10*3/uL (ref 1.5–6.5)
RBC: 3.72 10*6/uL (ref 3.70–5.45)
RDW: 17 % — ABNORMAL HIGH (ref 11.2–14.5)
nRBC: 0 % (ref 0–0)

## 2012-02-16 LAB — COMPREHENSIVE METABOLIC PANEL
ALT: 20 U/L (ref 0–35)
Alkaline Phosphatase: 83 U/L (ref 39–117)
Sodium: 135 mEq/L (ref 135–145)
Total Bilirubin: 0.3 mg/dL (ref 0.3–1.2)
Total Protein: 7.2 g/dL (ref 6.0–8.3)

## 2012-02-16 NOTE — Progress Notes (Signed)
ID: [name]   DOB: [birth year]  MR#: [id number]  WUJ#:[id number]  HISTORY OF PRESENT ILLNESS: The patient had routine annual mammography at [facility] 02/02/2012. There was a developing density in the inferior aspect of the right breast and on may 02/07/2012 the patient was brought back for additional views. This confirmed a new multilobulated density in the lower inner quadrant measuring 3.5 cm. By ultrasound this was multilobulated and hypoechoic. It measured at least 2 cm. Biopsy of this mass was obtained the the next day, and showed ([id number]) and invasive ductal carcinoma, grade 2 or 3, which was triple negative, with an MIB-1 of 75%.  Breast specific gamma imaging was obtained Feb 14 2012 and showed only a 2 cm focus of activity in the region of prior biopsy. The patient was evaluated at the multidisciplinary breast cancer clinic on 02/16/2012. Her subsequent history is as detailed below.  INTERVAL HISTORY: [name] is seen today with her husband [name]. She has a history of ovarian cancer as does her only sister. She brought [place] her prior genetic testing, which showed her to be BRCA1 and 2 negative. She tells me she is on line to receive a transplant kidney for her chronic renal failure.  REVIEW OF SYSTEMS: She has an anemia secondary to her renal failure and is receiving [name]. for this. This is associated with mild fatigue. Otherwise a detailed review of systems today was noncontributory and she has an excellent functional status were someone her age.  PAST MEDICAL HISTORY: Past Medical History  Diagnosis Date  . Anemia, chronic renal failure 10/08/2011  . Ovarian ca 10/08/2011  . Chronic kidney disease   . Gout   . Hypertension   . CKD (chronic kidney disease)   . H/O colonoscopy 2012  . H/O bone density study   . Fatigue   hypotheyroidism  PAST SURGICAL HISTORY: Past Surgical History  Procedure Date  . Appendectomy   . Abdominal hysterectomy Ovarian CA  s/p TAH-BSO  FAMILY  HISTORY Family History  Problem Relation Age of Onset  . Ovarian cancer Sister    the patient's father died at the age of 36 from renal failure likely secondary to hypertension. He did not have diabetes. The patient's mother died at the age of 76 from myocardial infarction. The patient had no brothers, one sister. That sister was diagnosed with ovarian cancer at age 37. She is currently 89 and in remission.  GYNECOLOGIC HISTORY: Menarche age 89, first live birth age 86, she is GX P3, change of life in approximately 73. She used hormone replacement for about 10 years.  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 Her husband [name] is a retired professor of speech in communications. Daughter [name] lives in [place]. She is disabled. Daughter [name] teaches at the [employer] school in [place]. Daughter [name] is a school principal in [place], [place]. The patient has 2 grandchildren. She attends the [employer]  ADVANCED DIRECTIVES: in place  HEALTH MAINTENANCE: History  Substance Use Topics  . Smoking status: Former Smoker -- 0.5 packs/day  . Smokeless tobacco: Not on file  . Alcohol Use: No     Colonoscopy: [name] 2012  PAP: [name] 2012  Bone density:  Lipid panel:  No Known Allergies  Current Outpatient Prescriptions  Medication Sig Dispense Refill  . alendronate (FOSAMAX) 10 MG tablet       .  allopurinol (ZYLOPRIM) 100 MG tablet       . darbepoetin (ARANESP) 100 MCG/0.5ML SOLN Inject into the skin every 3 (three) months.      Marland Kitchen losartan (COZAAR) 100 MG tablet       . SYNTHROID 25 MCG tablet       . ZEMPLAR 1 MCG capsule         OBJECTIVE: Elderly white woman, who appears well Filed Vitals:   02/16/12 1256  BP: 147/75  Pulse: 92  Temp: 98.2 F (36.8 C)     Body mass index is 22.03 kg/(m^2).    ECOG FS: 1  Sclerae unicteric Oropharynx  clear No peripheral adenopathy and specifically there is no palpable adenopathy in the right axilla Lungs no rales or rhonchi Heart regular rate and rhythm Abd benign MSK no focal spinal tenderness, no peripheral edema Neuro: nonfocal Breasts: Right breast is status post recent biopsy. There is some ecchymosis but no suspicious skin involvement, nipple retraction, or palpable mass. The left breast is unremarkable  LAB RESULTS: Lab Results  Component Value Date   WBC 6.2 02/16/2012   NEUTROABS 4.4 02/16/2012   HGB 12.3 02/16/2012   HCT 36.7 02/16/2012   MCV 98.6 02/16/2012   PLT 174 02/16/2012      Chemistry      Component Value Date/Time   NA 135 02/16/2012 1210   K 5.0 02/16/2012 1210   CL 102 02/16/2012 1210   CO2 23 02/16/2012 1210   BUN 99* 02/16/2012 1210   CREATININE 3.37* 02/16/2012 1210      Component Value Date/Time   CALCIUM 9.9 02/16/2012 1210   CALCIUM 9.5 04/05/2011 0844   ALKPHOS 83 02/16/2012 1210   AST 23 02/16/2012 1210   ALT 20 02/16/2012 1210   BILITOT 0.3 02/16/2012 1210       No results found for this basename: LABCA2    No components found with this basename: LABCA125    No results found for this basename: INR:1;PROTIME:1 in the last 168 hours  Urinalysis    Component Value Date/Time   COLORURINE YELLOW 02/20/2009 0915   APPEARANCEUR TURBID* 02/20/2009 0915   LABSPEC 1.011 02/20/2009 0915   PHURINE 6.0 02/20/2009 0915   GLUCOSEU NEGATIVE 02/20/2009 0915   HGBUR SMALL* 02/20/2009 0915   BILIRUBINUR NEGATIVE 02/20/2009 0915   KETONESUR NEGATIVE 02/20/2009 0915   PROTEINUR NEGATIVE 02/20/2009 0915   UROBILINOGEN 0.2 02/20/2009 0915   NITRITE POSITIVE* 02/20/2009 0915   LEUKOCYTESUR LARGE* 02/20/2009 0915    STUDIES: Discussed above  ASSESSMENT: 77 year old BRCA 1-2 negative [name] woman  (1) status post TAH/BSO January of 2004 for a stage IC ovarian cancer, followed by 3 cycles of carboplatin/docetaxel completed March of 2004  (2) stage 4/5 chronic  kidney disease, possibly doe to hypertension, earlier chemotherapy, or family genetics; on transplant list  (3) anemia of renal failure  (4) s/p right breast biopsy 02/08/2012 for a clinical T2 N0, stage IIA invasive ductal carcinoma, grade 2 or 3, triple negative, with an MIB-1 75%.   PLAN: We would over her breast cancer situation in detail and she has a very good understanding of the biology of her tumor. She is understandably of scared of chemotherapy, but chemotherapy is not out of the question since the most active agents used for breast cancer aren't metabolized by the liver area and the adjuvant program would quote her a risk of recurrence with local treatment only a 40%. She might get a risk reduction between 5 and 8%  depending on the agents used. She understands this means 53 women out of 100 like hair would get no benefit from chemotherapy, since a 60 would be essentially cured with local treatment only, and 32 would have the cancer recur despite chemotherapy.  I think it would be entirely reasonable for [facility] to receive some chemotherapy, whether more or less aggressive, or to simply be followed with observation. She does understand that most recurrences of will be distant and therefore not curable, although they will be treatable.  First at the surgery. I will be able to meet with her approximately 5 weeks from now. By that time we should have the definitive surgical information and should be able to make a definitive decision regarding chemotherapy. If she opts against chemotherapy I would see her in a every 3 month basis for the first 2 years alternating with Dr. Johna Sheriff. Thereafter we would brought in the followup interval.   Keeara Frees C    02/16/2012

## 2012-02-16 NOTE — Progress Notes (Signed)
I met with the patient and her husband in the multidisciplinary breast clinic today.  She opted not to complete the distress screening, indicating that she has good support and doing o.k.  This is a second cancer for her, so she feels she knows the process.  I did tell her about the [facility] and the available support services.

## 2012-02-16 NOTE — Progress Notes (Signed)
Radiation Oncology         [phone] ________________________________  Initial outpatient Consultation  Name: [name] MRN: [id number]  Date: 02/16/2012  DOB: [birth year]   REFERRING PHYSICIAN: Mariella Saa, MD  DIAGNOSIS: T2, N0, M0  right breast lower inner quadrant invasive ductal carcinoma. High-grade ER/PR negative HER-2/neu negative Ki-67 75%  HISTORY OF PRESENT ILLNESS::[name] is a 78 y.o. female with a prior history of ovarian cancer which was treated with surgery and adjuvant chemotherapy in 2004. She has been disease-free since then. She tested negative for BRCA.   Recently she was found to have a lesion in her right breast on screening mammography. A tumor measuring at least 2 cm was confirmed on ultrasound ; BSGI confirmed a lesion around the 6:00 position of the right breast. MRI cannot be performed due to her kidney issues.   Biopsy of the breast mass revealed high-grade invasive ductal carcinoma with details as described above in her diagnosis.  The patient is followed by Dr. Myna Hidalgo for anemia.  PEVIOUS RADIATION THERAPY: No  PAST MEDICAL HISTORY:  has a past medical history of Anemia, chronic renal failure (10/08/2011); Ovarian ca (10/08/2011); Chronic kidney disease; Gout; Hypertension; CKD (chronic kidney disease); H/O colonoscopy (2012); H/O bone density study; and Fatigue.    PAST SURGICAL HISTORY: Past Surgical History  Procedure Date  . Appendectomy   . Abdominal hysterectomy Ovarian CA   Gynecologic history: First menstrual cycle at age 40 ; last menstrual cycle in 1975. She was on hormone replacement therapy from 1978-88.  She is given birth to 3 children. Age at first live birth was 3. History of ovarian cancer as above. She reports that she is BRCA negative   FAMILY HISTORY: family history includes Ovarian cancer in her sister.  SOCIAL HISTORY:  reports that she has quit smoking. She does not have any smokeless tobacco history on file.  She reports that she does not drink alcohol or use illicit drugs. She is a professor.  ALLERGIES: Review of patient's allergies indicates no known allergies.  MEDICATIONS:  Current Outpatient Prescriptions  Medication Sig Dispense Refill  . alendronate (FOSAMAX) 10 MG tablet       . allopurinol (ZYLOPRIM) 100 MG tablet       . darbepoetin (ARANESP) 100 MCG/0.5ML SOLN Inject into the skin every 3 (three) months.      Marland Kitchen losartan (COZAAR) 100 MG tablet       . SYNTHROID 25 MCG tablet       . ZEMPLAR 1 MCG capsule         REVIEW OF SYSTEMS:  A 15 point review of systems is documented in the electronic medical record. This was obtained by the nursing staff. However, I reviewed this with the patient to discuss relevant findings and make appropriate changes.    PHYSICAL EXAM:  Blood pressure 147/75, pulse 92 temperature 98.2 weight 149 pound height 5 foot 9 inches General: Alert and oriented, in no acute distress HEENT: Head is normocephalic. Pupils are equally round and reactive to light. Extraocular movements are intact. Oropharynx is clear. Neck: Neck is supple, no palpable cervical or supraclavicular lymphadenopathy. Heart: Regular in rate and rhythm with no murmurs, rubs, or gallops. Chest: Clear to auscultation bilaterally, with no rhonchi, wheezes, or rales. Abdomen: Soft, nontender, nondistended, with no rigidity or guarding. Extremities: No cyanosis or edema. Lymphatics: No concerning lymphadenopathy. Skin: No concerning lesions. Musculoskeletal: symmetric strength and muscle tone throughout. Neurologic: Cranial nerves II through XII  are grossly intact. No obvious focalities. Speech is fluent. Coordination is intact. Psychiatric: Judgment and insight are intact. Affect is appropriate. Breasts: No palpable axillary lymphadenopathy bilaterally. Left breast is benign to palpation. There is a lower inner quadrant right breast biopsy scar with associated ecchymosis. I did not palpate  deeply due to patient discomfort.    LABORATORY DATA:  Lab Results  Component Value Date   WBC 6.2 02/16/2012   HGB 12.3 02/16/2012   HCT 36.7 02/16/2012   MCV 98.6 02/16/2012   PLT 174 02/16/2012   Lab Results  Component Value Date   NA 135 02/16/2012   K 5.0 02/16/2012   CL 102 02/16/2012   CO2 23 02/16/2012   Lab Results  Component Value Date   ALT 20 02/16/2012   AST 23 02/16/2012   ALKPHOS 83 02/16/2012   BILITOT 0.3 02/16/2012   Pathology as above   RADIOGRAPHY: As above    IMPRESSION/PLAN:  Is a very pleasant 78 year old woman with clinical T2, N0, M0 right breast cancer.  She has been discussed at our multidisciplinary tumor board.  The consensus is that she would be a good candidate for breast conservation. I talked to her about the option of a mastectomy and informed her that her expected overall survival would be equivalent between mastectomy and breast conservation, based upon randomized controlled data. She is enthusiastic about breast conservation.   The plan is for her to undergo a lumpectomy and sentinel lymph node biopsy. Possibly, she will receive chemotherapy after this. Finally, she understands that radiotherapy will be an important modality to decrease her risk of a local recurrence;  It would decrease the risk of a recurrence in the breast by about 2/3.  I explained that she would receive between 4-6 weeks of radiotherapy to her right breast. We talked about the logistics of radiotherapy as well as the risks benefits and side effects of radiotherapy. We discussed skin irritation and fatigue as well  rare late effects such as lung injury. She is enthusiastic about treatment. I look forward to participating in her care when she is ready for radiotherapy . -----------------------------------  [name], MD

## 2012-02-16 NOTE — Progress Notes (Signed)
Subjective:  new diagnosis cancer right breast  Patient ID: [name], female   DOB: [birth year], 78 y.o.   MRN: [id number]  HPI Patient is a pleasant 78 year old female referred by Dr. Tilda Burrow at [facility] for a new diagnosis of cancer of the right breast. The patient has a personal history of ovarian cancer. Her sister has also had ovarian cancer. They have been genetically tested in the past and by her history are BRCA negative. She has had yearly mammograms. Recent mammogram revealed a new mass in the right breast. Diagnostic mammogram revealed a multilobulated density in the lower inner quadrant of the right breast measuring a maximum of 3.5 cm. Right breast ultrasound revealed a hypoechoic mass measuring at least 2 cm. Large core needle biopsy was recommended and performed. This has revealed an ER/PR negative HER-2 negative grade 2 invasive ductal carcinoma. Due to renal disease she underwent gamma imaging instead of MRI which confirmed malignancy approximately 2 cm in the known area and no other abnormalities. The patient has significant renal insufficiency secondary to chemotherapy for her ovarian cancer and understandably absolutely refuses any further chemotherapy. She is here in the breast multidisciplinary clinic today to discuss treatment for her breast cancer.  Past Medical History  Diagnosis Date  . Anemia, chronic renal failure 10/08/2011  . Ovarian ca 10/08/2011  . Chronic kidney disease   . Gout   . Hypertension    Past Surgical History  Procedure Date  . Appendectomy   . Abdominal hysterectomy Ovarian CA   Current Outpatient Prescriptions  Medication Sig Dispense Refill  . alendronate (FOSAMAX) 10 MG tablet       . allopurinol (ZYLOPRIM) 100 MG tablet       . darbepoetin (ARANESP) 100 MCG/0.5ML SOLN Inject into the skin every 3 (three) months.      Marland Kitchen losartan (COZAAR) 100 MG tablet       . SYNTHROID 25 MCG tablet       . ZEMPLAR 1 MCG capsule        No Known  Allergies   Review of Systems  Respiratory: Negative.   Cardiovascular: Negative.   Gastrointestinal: Negative.   Musculoskeletal: Positive for arthralgias.  Neurological: Positive for dizziness.       Objective:   Physical Exam General: Alert elderly Caucasian female Skin: Warm and dry no rash or infection HEENT: No palpable mass or thyromegaly. Sclera nonicteric. Lymph nodes: No palpable cervical, supraclavicular, or axillary nodes Breasts: There is bruising in the lower inner right breast. About 3 cm from the nipple at the 4 to 5:00 position is an approximately 2 cm freely movable mass. The skin or nipple changes. No discernible masses otherwise in either breast Lungs: Clear without wheezing or increased work of breathing Cardiovascular: Regular rate and rhythm. No murmurs. No edema. Extremities: She is limited extension of her right arm to about 30 from previous fracture. No edema. Neuro: Alert and fully oriented. Gait normal.     Assessment:     Diagnosis of triple negative invasive ductal carcinoma lower inner right breast., 2 cm by ultrasound but a 3.5 cm by mammogram. Exam would indicate closer to the smaller size. She has previous history of ovarian cancer but is genetically negative. She has chronic renal insufficiency secondary to chemotherapy and absolutely and understandably refuses any chemotherapy. We discussed surgical options including lumpectomy and mastectomy. We discussed there is no survival difference. She would need  breast radiation with lumpectomy and is amenable to this pending  discussion with radiation oncology.we discussed a sentinel lymph node biopsy. This would not change her treatment plan but she is very interested in staging and desires a sentinel lymph node biopsy which I think is reasonable.we discussed the nature of the surgery and risks of anesthetic complications, bleeding, infection, slight risk of lymphedema, and risk of needing further surgery up  to and including total mastectomy based on final pathology. She understands with her breast and the size of the tumor there could be some deformity. All of her and her husband's questions were answered    Plan:     Needle localized right breast lumpectomy with right axillary sentinel lymph node biopsy as an outpatient under general anesthesia pending discussion with radiation oncology.

## 2012-02-16 NOTE — Telephone Encounter (Signed)
gave patient appointment 03-23-2012 at 4:30pm  printed out calendar and gave to the patient

## 2012-02-18 ENCOUNTER — Telehealth (INDEPENDENT_AMBULATORY_CARE_PROVIDER_SITE_OTHER): Payer: Self-pay | Admitting: General Surgery

## 2012-02-18 NOTE — Telephone Encounter (Signed)
Pt calling to ask Dr. Johna Sheriff to schedule her surgery as soon as possible.  She is very anxious to get the cancer out!  Pat in scheduling is waiting to hear back from Dr. Johna Sheriff to get it added on.  She understands and asks we leave messages on both home and cell phone if we call to let her know on Monday, as she may be out of town that day.

## 2012-02-21 ENCOUNTER — Telehealth: Payer: Self-pay | Admitting: *Deleted

## 2012-02-21 NOTE — Telephone Encounter (Signed)
S/w pt regarding Careplan Summary as she did not have any questions.  Pt.  Is concerned regarding late surgery date and is exploring other options.  It was explained that usually surgeons need to wait for an vacant or.  Strongly encouraged not cancel appointments. Pt. Verbalized understanding.  She understands to call with questions.

## 2012-02-22 ENCOUNTER — Other Ambulatory Visit: Payer: Self-pay | Admitting: Oncology

## 2012-02-25 ENCOUNTER — Encounter: Payer: Self-pay | Admitting: *Deleted

## 2012-02-25 NOTE — Progress Notes (Signed)
Mailed after appt letter to pt. 

## 2012-02-29 ENCOUNTER — Encounter (HOSPITAL_BASED_OUTPATIENT_CLINIC_OR_DEPARTMENT_OTHER): Payer: Self-pay | Admitting: *Deleted

## 2012-02-29 NOTE — Progress Notes (Signed)
To come in for bmet,ekg

## 2012-03-01 ENCOUNTER — Encounter (HOSPITAL_BASED_OUTPATIENT_CLINIC_OR_DEPARTMENT_OTHER): Payer: Self-pay | Admitting: *Deleted

## 2012-03-01 ENCOUNTER — Encounter (HOSPITAL_BASED_OUTPATIENT_CLINIC_OR_DEPARTMENT_OTHER)
Admission: RE | Admit: 2012-03-01 | Discharge: 2012-03-01 | Disposition: A | Discharge status: Home / Self Care | Payer: Medicare Other | Source: Ambulatory Visit | Attending: General Surgery | Admitting: General Surgery

## 2012-03-01 LAB — BASIC METABOLIC PANEL
BUN: 66 mg/dL — ABNORMAL HIGH (ref 6–23)
Creatinine, Ser: 2.81 mg/dL — ABNORMAL HIGH (ref 0.50–1.10)
GFR calc non Af Amer: 15 mL/min — ABNORMAL LOW (ref >90)
Glucose, Bld: 103 mg/dL — ABNORMAL HIGH (ref 70–99)
Potassium: 4.6 mEq/L (ref 3.5–5.1)

## 2012-03-01 NOTE — Progress Notes (Signed)
Pt. Is unable to get right arm up very high.  Will need to be careful with positioning.

## 2012-03-01 NOTE — H&P (Signed)
Pt. In for EKG and labwork as part of pre op criteria.  EKG reviewed with Dr. Ivin Booty who asked me to get previous EKG for comparison.  Information obtained from [facility].  All info reviewed by Dr. Ivin Booty.  Due to changes in EKG, Dr. Ivin Booty cancelled the procedure for tomorrow pending cardiac clearance.  Dr. Johna Sheriff and patient notified by RN.  Patient greatly distressed and asked for more clarification by Dr. Ivin Booty.  Dr. Ivin Booty explained in detail his concerns regarding her cardiac changes and how to provide the best care to her.  Patient more understanding after speaking with Dr. Ivin Booty.  [facility] called in order to facilitate a prompt appointment for patient.  Appointment obtained for patient for tomorrow ( 03/02/12) at noon.  Patient notified. Dr. Johna Sheriff office called, informed of pending cardiology appointment and asked them to facilitate a rescheduling for patient.

## 2012-03-02 ENCOUNTER — Encounter: Payer: Self-pay | Admitting: Internal Medicine

## 2012-03-02 ENCOUNTER — Ambulatory Visit (INDEPENDENT_AMBULATORY_CARE_PROVIDER_SITE_OTHER): Payer: Medicare Other | Admitting: Internal Medicine

## 2012-03-02 ENCOUNTER — Encounter (HOSPITAL_BASED_OUTPATIENT_CLINIC_OR_DEPARTMENT_OTHER): Admission: RE | Payer: Self-pay | Source: Ambulatory Visit

## 2012-03-02 ENCOUNTER — Other Ambulatory Visit (HOSPITAL_COMMUNITY): Payer: Medicare Other

## 2012-03-02 ENCOUNTER — Ambulatory Visit (HOSPITAL_BASED_OUTPATIENT_CLINIC_OR_DEPARTMENT_OTHER): Admission: RE | Admit: 2012-03-02 | Payer: Medicare Other | Source: Ambulatory Visit | Admitting: General Surgery

## 2012-03-02 VITALS — BP 150/81 | HR 97 | Ht 69.0 in | Wt 149.0 lb

## 2012-03-02 DIAGNOSIS — I10 Essential (primary) hypertension: Secondary | ICD-10-CM

## 2012-03-02 DIAGNOSIS — R9431 Abnormal electrocardiogram [ECG] [EKG]: Secondary | ICD-10-CM

## 2012-03-02 DIAGNOSIS — Z01810 Encounter for preprocedural cardiovascular examination: Secondary | ICD-10-CM

## 2012-03-02 SURGERY — BREAST LUMPECTOMY WITH NEEDLE LOCALIZATION AND AXILLARY SENTINEL LYMPH NODE BX
Anesthesia: General | Laterality: Right

## 2012-03-02 NOTE — Progress Notes (Signed)
Patient ID: [name], female   DOB: 06/21/1931, 78 y.o.   MRN: [id number]

## 2012-03-02 NOTE — Progress Notes (Addendum)
HPI  Patient is a 78 year old who is referred for preop risk stratification. The patient has no known history of CAD.  Reports a stress test about 1 year ago at [facility].  Part of pre renal tx evaluation. Resultss not available but patient said "looked good"  Echo in November 2012 showed normal LV systolic function.    The patient denies CP.  Breathing is OK.  Not that active.  Lazy.  Can climb stairs without a problem.   No syncope.  No dizziness.  Being evaluated for breast surgery.  EKG showed SR.  Possible IWMI.   Allergies  Allergen Reactions  . Nsaids     Pt. Does not want this type drug due to impaired renal function  . Renal (Actical)     DOES NOT WANT ANY MEDS THAT EFFECT RENAL FUNTION    Current Outpatient Prescriptions  Medication Sig Dispense Refill  . alendronate (FOSAMAX) 10 MG tablet Take 10 mg by mouth daily before breakfast.       . allopurinol (ZYLOPRIM) 100 MG tablet Take 100 mg by mouth daily.       Marland Kitchen aspirin 81 MG tablet Take 81 mg by mouth daily.      . darbepoetin (ARANESP) 100 MCG/0.5ML SOLN Inject into the skin every 3 (three) months.      Marland Kitchen losartan (COZAAR) 50 MG tablet Take 50 mg by mouth daily.      Marland Kitchen SYNTHROID 25 MCG tablet Take 25 mcg by mouth daily.       Marland Kitchen ZEMPLAR 1 MCG capsule 1 tab every other day        Past Medical History  Diagnosis Date  . Anemia, chronic renal failure 10/08/2011  . Gout   . CKD (chronic kidney disease)   . H/O colonoscopy 2012  . H/O bone density study   . Fatigue   . Hypertension   . Chronic kidney disease     stage 4 renal disease-on transplant list  . Ovarian ca 10/08/2011  . Complication of anesthesia     hard to wake up    Past Surgical History  Procedure Date  . Appendectomy   . Abdominal hysterectomy Ovarian CA  . Shoulder arthroscopy 2010    rt  . Eye surgery     both cataracts    Family History  Problem Relation Age of Onset  . Ovarian cancer Sister     History   Social History  .  Marital Status: Married    Spouse Name: N/A    Number of Children: N/A  . Years of Education: N/A   Occupational History  . Not on file.   Social History Main Topics  . Smoking status: Former Smoker -- 0.5 packs/day    Quit date: 03/01/1963  . Smokeless tobacco: Not on file  . Alcohol Use: No  . Drug Use: No  . Sexually Active:    Other Topics Concern  . Not on file   Social History Narrative  . No narrative on file    Review of Systems:  All systems reviewed.  They are negative to the above problem except as previously stated.  Vital Signs: BP 150/81  Pulse 97  Ht 5\' 9"  (1.753 m)  Wt 149 lb (67.586 kg)  BMI 22.00 kg/m2  Physical Exam Patient is in NAD HEENT:  Normocephalic, atraumatic. EOMI, PERRLA.  Neck: JVP is normal. No thyromegaly. No bruits.  Lungs: clear to auscultation. No rales no wheezes.  Heart:  Regular rate and rhythm. Normal S1, S2. No S3.   No significant murmurs. PMI not displaced.  Abdomen:  Supple, nontender. Normal bowel sounds. No masses. No hepatomegaly.  Extremities:   Good distal pulses throughout. No lower extremity edema.  Musculoskeletal :moving all extremities.  Neuro:   alert and oriented x3.  CN II-XII grossly intact.  EKG:  SR 97  Q wave III, AVF.  t wave inversion V1, V2.  Assessment and Plan:  1.  Preop evaluation.  On review of EKG from 2004, patient had inferior and anterior EKG chagnes at that time. No signfi change from current EKG>  Echo in November 2012 with normal LV function.  She is not that active, but I get no symtoms to sugg angina.  Overall, I feel she is a low risk for major cardiac event with upcoming surgery  OK po proceed  2.  HTN:  BP is a little high.  Will need to be followed  3.  CRI  Followed elsewhere.

## 2012-03-03 ENCOUNTER — Other Ambulatory Visit (HOSPITAL_COMMUNITY): Payer: Medicare Other

## 2012-03-03 ENCOUNTER — Institutional Professional Consult (permissible substitution): Payer: Medicare Other | Admitting: Internal Medicine

## 2012-03-06 ENCOUNTER — Encounter (HOSPITAL_BASED_OUTPATIENT_CLINIC_OR_DEPARTMENT_OTHER): Payer: Self-pay | Admitting: *Deleted

## 2012-03-06 ENCOUNTER — Encounter (HOSPITAL_BASED_OUTPATIENT_CLINIC_OR_DEPARTMENT_OTHER)
Admission: RE | Admit: 2012-03-06 | Discharge: 2012-03-06 | Disposition: A | Discharge status: Home / Self Care | Payer: Medicare Other | Source: Ambulatory Visit | Attending: General Surgery | Admitting: General Surgery

## 2012-03-06 LAB — BASIC METABOLIC PANEL
BUN: 62 mg/dL — ABNORMAL HIGH (ref 6–23)
CO2: 24 mEq/L (ref 19–32)
Calcium: 9.5 mg/dL (ref 8.4–10.5)
Chloride: 100 mEq/L (ref 96–112)
Creatinine, Ser: 3.23 mg/dL — ABNORMAL HIGH (ref 0.50–1.10)
GFR calc Af Amer: 15 mL/min — ABNORMAL LOW (ref >90)

## 2012-03-06 NOTE — Progress Notes (Signed)
Dr. Johna Sheriff notified of abnormal labs.  Flagged for anesthesiologist to review in AM.

## 2012-03-06 NOTE — Progress Notes (Signed)
Surgery was r/s due to chg ekg-saw dr ross-cleared for surg bmet done

## 2012-03-07 ENCOUNTER — Encounter: Payer: Self-pay | Admitting: *Deleted

## 2012-03-07 ENCOUNTER — Telehealth: Payer: Self-pay | Admitting: *Deleted

## 2012-03-07 NOTE — Telephone Encounter (Signed)
Returned phone call to patient to explain concern has been sent to med. Onc. It was also explained follow-up appointments are to address concerns, to provide surgical information, and proceed with care.  Patient not happy with explanation, stated she still does not understand why she has to follow-up with med onc. Will notify provider.

## 2012-03-07 NOTE — Telephone Encounter (Signed)
Spoke with patient to review upcoming appointments.  Explained she will meet with med onc. to discuss further treatment options and radiation.  Patient stated she told provider she is not taking chemo and does not understand why she has to meet with him.  RN to f/u.

## 2012-03-07 NOTE — Progress Notes (Signed)
Spoke with Dr. Darnelle Catalan regarding patient concerns.  He will send letter.

## 2012-03-07 NOTE — Progress Notes (Signed)
Labwork reviewed with DR. Jean Rosenthal.  Will need  Istat morning of surgery.  Order placed.

## 2012-03-08 ENCOUNTER — Other Ambulatory Visit (HOSPITAL_COMMUNITY): Payer: Medicare Other

## 2012-03-08 ENCOUNTER — Encounter (HOSPITAL_BASED_OUTPATIENT_CLINIC_OR_DEPARTMENT_OTHER): Payer: Self-pay | Admitting: Certified Registered Nurse Anesthetist

## 2012-03-08 ENCOUNTER — Ambulatory Visit (HOSPITAL_COMMUNITY)
Admission: RE | Admit: 2012-03-08 | Discharge: 2012-03-08 | Disposition: A | Discharge status: Home / Self Care | Payer: Medicare Other | Source: Ambulatory Visit | Attending: General Surgery | Admitting: General Surgery

## 2012-03-08 ENCOUNTER — Encounter (HOSPITAL_BASED_OUTPATIENT_CLINIC_OR_DEPARTMENT_OTHER): Payer: Self-pay | Admitting: *Deleted

## 2012-03-08 ENCOUNTER — Encounter (HOSPITAL_BASED_OUTPATIENT_CLINIC_OR_DEPARTMENT_OTHER)
Admission: RE | Disposition: A | Discharge status: Home / Self Care | Payer: Self-pay | Source: Ambulatory Visit | Attending: General Surgery

## 2012-03-08 ENCOUNTER — Ambulatory Visit (HOSPITAL_BASED_OUTPATIENT_CLINIC_OR_DEPARTMENT_OTHER)
Admission: RE | Admit: 2012-03-08 | Discharge: 2012-03-08 | Disposition: A | Discharge status: Home / Self Care | Payer: Medicare Other | Source: Ambulatory Visit | Attending: General Surgery | Admitting: General Surgery

## 2012-03-08 ENCOUNTER — Ambulatory Visit (HOSPITAL_BASED_OUTPATIENT_CLINIC_OR_DEPARTMENT_OTHER): Payer: Medicare Other | Admitting: Certified Registered Nurse Anesthetist

## 2012-03-08 DIAGNOSIS — Z01812 Encounter for preprocedural laboratory examination: Secondary | ICD-10-CM | POA: Insufficient documentation

## 2012-03-08 DIAGNOSIS — N189 Chronic kidney disease, unspecified: Secondary | ICD-10-CM | POA: Insufficient documentation

## 2012-03-08 DIAGNOSIS — I129 Hypertensive chronic kidney disease with stage 1 through stage 4 chronic kidney disease, or unspecified chronic kidney disease: Secondary | ICD-10-CM | POA: Insufficient documentation

## 2012-03-08 DIAGNOSIS — C50919 Malignant neoplasm of unspecified site of unspecified female breast: Secondary | ICD-10-CM

## 2012-03-08 DIAGNOSIS — C569 Malignant neoplasm of unspecified ovary: Secondary | ICD-10-CM | POA: Insufficient documentation

## 2012-03-08 DIAGNOSIS — Z01810 Encounter for preprocedural cardiovascular examination: Secondary | ICD-10-CM | POA: Insufficient documentation

## 2012-03-08 DIAGNOSIS — C50319 Malignant neoplasm of lower-inner quadrant of unspecified female breast: Secondary | ICD-10-CM

## 2012-03-08 HISTORY — PX: MASTECTOMY PARTIAL / LUMPECTOMY W/ AXILLARY LYMPHADENECTOMY: SUR852

## 2012-03-08 LAB — POCT I-STAT, CHEM 8
Calcium, Ion: 1.19 mmol/L (ref 1.12–1.32)
Chloride: 113 mEq/L — ABNORMAL HIGH (ref 96–112)
HCT: 34 % — ABNORMAL LOW (ref 36.0–46.0)
Potassium: 5.5 mEq/L — ABNORMAL HIGH (ref 3.5–5.1)
Sodium: 137 mEq/L (ref 135–145)

## 2012-03-08 LAB — HM MAMMOGRAPHY

## 2012-03-08 SURGERY — PARTIAL MASTECTOMY WITH AXILLARY SENTINEL LYMPH NODE BIOPSY AND NEEDLE LOCALIZATION AND MAMMOSITE EVAL DEVICE
Anesthesia: General | Site: Breast | Laterality: Right

## 2012-03-08 MED ORDER — METHYLENE BLUE 1 % INJ SOLN
INTRAMUSCULAR | Status: DC | PRN
  Administered 2012-03-08: 2 mL via INTRADERMAL

## 2012-03-08 MED ORDER — METOCLOPRAMIDE HCL 5 MG/ML IJ SOLN: 10.0000 mg | Freq: Once | INTRAMUSCULAR | Status: DC | PRN

## 2012-03-08 MED ORDER — FENTANYL CITRATE 0.05 MG/ML IJ SOLN
50.0000 ug | INTRAMUSCULAR | Status: DC | PRN
  Administered 2012-03-08: 50 ug via INTRAVENOUS

## 2012-03-08 MED ORDER — CEFAZOLIN SODIUM 1-5 GM-% IV SOLN
1.0000 g | INTRAVENOUS | Status: AC
  Administered 2012-03-08: 1 g via INTRAVENOUS

## 2012-03-08 MED ORDER — MIDAZOLAM HCL 2 MG/2ML IJ SOLN
0.5000 mg | INTRAMUSCULAR | Status: DC | PRN
  Administered 2012-03-08: 1 mg via INTRAVENOUS

## 2012-03-08 MED ORDER — SODIUM CHLORIDE 0.9 % IJ SOLN
INTRAMUSCULAR | Status: DC | PRN
  Administered 2012-03-08: 3 mL via INTRAVENOUS

## 2012-03-08 MED ORDER — MIDAZOLAM HCL 5 MG/5ML IJ SOLN
INTRAMUSCULAR | Status: DC | PRN
  Administered 2012-03-08: 0.5 mg via INTRAVENOUS

## 2012-03-08 MED ORDER — ONDANSETRON HCL 4 MG/2ML IJ SOLN
INTRAMUSCULAR | Status: DC | PRN
  Administered 2012-03-08: 4 mg via INTRAVENOUS

## 2012-03-08 MED ORDER — HYDROMORPHONE HCL PF 1 MG/ML IJ SOLN
0.2500 mg | INTRAMUSCULAR | Status: DC | PRN
  Administered 2012-03-08 (×2): 0.5 mg via INTRAVENOUS

## 2012-03-08 MED ORDER — METOCLOPRAMIDE HCL 5 MG/ML IJ SOLN
INTRAMUSCULAR | Status: DC | PRN
  Administered 2012-03-08: 10 mg via INTRAVENOUS

## 2012-03-08 MED ORDER — FENTANYL CITRATE 0.05 MG/ML IJ SOLN
INTRAMUSCULAR | Status: DC | PRN
  Administered 2012-03-08: 100 ug via INTRAVENOUS

## 2012-03-08 MED ORDER — OXYCODONE HCL 5 MG PO TABS: 5.0000 mg | ORAL_TABLET | Freq: Once | ORAL | Status: DC | PRN

## 2012-03-08 MED ORDER — BUPIVACAINE HCL (PF) 0.25 % IJ SOLN
INTRAMUSCULAR | Status: DC | PRN
  Administered 2012-03-08: 20 mL

## 2012-03-08 MED ORDER — LACTATED RINGERS IV SOLN
INTRAVENOUS | Status: DC
  Administered 2012-03-08: 12:00:00 via INTRAVENOUS

## 2012-03-08 MED ORDER — SODIUM CHLORIDE 0.9 % IV SOLN
INTRAVENOUS | Status: DC | PRN
  Administered 2012-03-08 (×2): via INTRAVENOUS

## 2012-03-08 MED ORDER — DEXAMETHASONE SODIUM PHOSPHATE 4 MG/ML IJ SOLN
INTRAMUSCULAR | Status: DC | PRN
  Administered 2012-03-08: 10 mg via INTRAVENOUS

## 2012-03-08 MED ORDER — LIDOCAINE HCL (CARDIAC) 20 MG/ML IV SOLN
INTRAVENOUS | Status: DC | PRN
  Administered 2012-03-08: 50 mg via INTRAVENOUS

## 2012-03-08 MED ORDER — PHENYLEPHRINE HCL 10 MG/ML IJ SOLN
INTRAMUSCULAR | Status: DC | PRN
  Administered 2012-03-08: 80 ug via INTRAVENOUS

## 2012-03-08 MED ORDER — TECHNETIUM TC 99M SULFUR COLLOID FILTERED
1.0000 | Freq: Once | INTRAVENOUS | Status: AC | PRN
  Administered 2012-03-08: 1 via INTRADERMAL

## 2012-03-08 MED ORDER — ACETAMINOPHEN 10 MG/ML IV SOLN
1000.0000 mg | Freq: Once | INTRAVENOUS | Status: AC
  Administered 2012-03-08: 1000 mg via INTRAVENOUS

## 2012-03-08 MED ORDER — PROPOFOL 10 MG/ML IV EMUL
INTRAVENOUS | Status: DC | PRN
  Administered 2012-03-08: 200 mg via INTRAVENOUS

## 2012-03-08 MED ORDER — HYDROCODONE-ACETAMINOPHEN 5-325 MG PO TABS: 1.0000 | ORAL_TABLET | ORAL | Status: AC | PRN

## 2012-03-08 SURGICAL SUPPLY — 63 items
APPLIER CLIP 11 MED OPEN (CLIP)
BINDER BREAST LRG (GAUZE/BANDAGES/DRESSINGS) ×2 IMPLANT
BLADE SURG 10 STRL SS (BLADE) ×2 IMPLANT
BLADE SURG 15 STRL LF DISP TIS (BLADE) ×1 IMPLANT
BLADE SURG 15 STRL SS (BLADE) ×1
CANISTER SUCTION 1200CC (MISCELLANEOUS) ×2 IMPLANT
CHLORAPREP W/TINT 26ML (MISCELLANEOUS) ×2 IMPLANT
CLIP APPLIE 11 MED OPEN (CLIP) IMPLANT
CLIP TI WIDE RED SMALL 6 (CLIP) ×2 IMPLANT
CLOTH BEACON ORANGE TIMEOUT ST (SAFETY) ×2 IMPLANT
COVER MAYO STAND STRL (DRAPES) ×2 IMPLANT
COVER PROBE 5X48 (MISCELLANEOUS)
COVER PROBE W GEL 5X96 (DRAPES) ×2 IMPLANT
COVER TABLE BACK 60X90 (DRAPES) ×2 IMPLANT
DECANTER SPIKE VIAL GLASS SM (MISCELLANEOUS) IMPLANT
DERMABOND ADVANCED (GAUZE/BANDAGES/DRESSINGS) ×2
DERMABOND ADVANCED .7 DNX12 (GAUZE/BANDAGES/DRESSINGS) ×2 IMPLANT
DEVICE CAVITY EVALUATION 9031 (MISCELLANEOUS) IMPLANT
DEVICE DUBIN W/COMP PLATE 8390 (MISCELLANEOUS) ×2 IMPLANT
DRAIN CHANNEL 19F RND (DRAIN) IMPLANT
DRAIN HEMOVAC 1/8 X 5 (WOUND CARE) IMPLANT
DRAPE LAPAROSCOPIC ABDOMINAL (DRAPES) ×2 IMPLANT
DRAPE UTILITY XL STRL (DRAPES) ×2 IMPLANT
ELECT COATED BLADE 2.86 ST (ELECTRODE) ×2 IMPLANT
ELECT REM PT RETURN 9FT ADLT (ELECTROSURGICAL) ×2
ELECTRODE REM PT RTRN 9FT ADLT (ELECTROSURGICAL) ×1 IMPLANT
EVACUATOR SILICONE 100CC (DRAIN) IMPLANT
GLOVE BIOGEL PI IND STRL 8 (GLOVE) ×1 IMPLANT
GLOVE BIOGEL PI INDICATOR 8 (GLOVE) ×1
GLOVE SKINSENSE NS SZ7.0 (GLOVE) ×1
GLOVE SKINSENSE STRL SZ7.0 (GLOVE) ×1 IMPLANT
GLOVE SS BIOGEL STRL SZ 7.5 (GLOVE) ×1 IMPLANT
GLOVE SUPERSENSE BIOGEL SZ 7.5 (GLOVE) ×1
GOWN PREVENTION PLUS XLARGE (GOWN DISPOSABLE) ×2 IMPLANT
GOWN PREVENTION PLUS XXLARGE (GOWN DISPOSABLE) ×2 IMPLANT
KIT CVR 48X5XPRB PLUP LF (MISCELLANEOUS) IMPLANT
KIT MARKER MARGIN INK (KITS) ×2 IMPLANT
NDL SAFETY ECLIPSE 18X1.5 (NEEDLE) ×1 IMPLANT
NEEDLE HYPO 18GX1.5 SHARP (NEEDLE) ×1
NEEDLE HYPO 25X1 1.5 SAFETY (NEEDLE) ×4 IMPLANT
NS IRRIG 1000ML POUR BTL (IV SOLUTION) ×2 IMPLANT
PACK BASIN DAY SURGERY FS (CUSTOM PROCEDURE TRAY) ×2 IMPLANT
PAD ALCOHOL SWAB (MISCELLANEOUS) ×2 IMPLANT
PENCIL BUTTON HOLSTER BLD 10FT (ELECTRODE) ×2 IMPLANT
PIN SAFETY STERILE (MISCELLANEOUS) IMPLANT
SLEEVE SCD COMPRESS KNEE MED (MISCELLANEOUS) IMPLANT
SPONGE LAP 18X18 X RAY DECT (DISPOSABLE) IMPLANT
SPONGE LAP 4X18 X RAY DECT (DISPOSABLE) ×2 IMPLANT
STAPLER VISISTAT 35W (STAPLE) IMPLANT
SUT ETHILON 3 0 FSL (SUTURE) IMPLANT
SUT MON AB 4-0 PC3 18 (SUTURE) IMPLANT
SUT MON AB 5-0 PS2 18 (SUTURE) ×2 IMPLANT
SUT SILK 2 0 FS (SUTURE) ×2 IMPLANT
SUT SILK 3 0 SH 30 (SUTURE) IMPLANT
SUT VIC AB 3-0 SH 27 (SUTURE) ×2
SUT VIC AB 3-0 SH 27X BRD (SUTURE) ×2 IMPLANT
SUT VICRYL 3-0 CR8 SH (SUTURE) IMPLANT
SYR CONTROL 10ML LL (SYRINGE) ×4 IMPLANT
TOWEL OR 17X24 6PK STRL BLUE (TOWEL DISPOSABLE) ×4 IMPLANT
TOWEL OR NON WOVEN STRL DISP B (DISPOSABLE) ×2 IMPLANT
TUBE CONNECTING 20X1/4 (TUBING) ×2 IMPLANT
WATER STERILE IRR 1000ML POUR (IV SOLUTION) ×2 IMPLANT
YANKAUER SUCT BULB TIP NO VENT (SUCTIONS) ×2 IMPLANT

## 2012-03-08 NOTE — Anesthesia Procedure Notes (Signed)
Procedure Name: LMA Insertion Date/Time: 03/08/2012 1:33 PM Performed by: Zenia Resides D Pre-anesthesia Checklist: Patient identified, Emergency Drugs available, Suction available, Patient being monitored and Timeout performed Patient Re-evaluated:Patient Re-evaluated prior to inductionOxygen Delivery Method: Circle System Utilized Preoxygenation: Pre-oxygenation with 100% oxygen Intubation Type: IV induction Ventilation: Mask ventilation without difficulty LMA: LMA inserted LMA Size: 4.0 Number of attempts: 1 Airway Equipment and Method: bite block Placement Confirmation: positive ETCO2 and breath sounds checked- equal and bilateral Tube secured with: Tape Dental Injury: Teeth and Oropharynx as per pre-operative assessment

## 2012-03-08 NOTE — Progress Notes (Signed)
Istat results reviewed with Dr. Gelene Mink. OK to proceed with surgery.

## 2012-03-08 NOTE — Interval H&P Note (Signed)
History and Physical Interval Note:  03/08/2012 1:09 PM  [name]  has presented today for surgery, with the diagnosis of Right breast cancer  The various methods of treatment have been discussed with the patient and family. After consideration of risks, benefits and other options for treatment, the patient has consented to  Procedure(s) (LRB): PARTIAL MASTECTOMY WITH AXILLARY SENTINEL LYMPH NODE BIOPSY AND NEEDLE LOCALIZATION AND MAMMOSITE EVAL DEVICE (Right) as a surgical intervention .  The patients' history has been reviewed, patient examined, no change in status, stable for surgery.  I have reviewed the patients' chart and labs.  Questions were answered to the patient's satisfaction.     Shayon Trompeter T

## 2012-03-08 NOTE — Anesthesia Postprocedure Evaluation (Signed)
Anesthesia Post Note  Patient: [name]  Procedure(s) Performed: Procedure(s) (LRB): PARTIAL MASTECTOMY WITH AXILLARY SENTINEL LYMPH NODE BIOPSY AND NEEDLE LOCALIZATION AND MAMMOSITE EVAL DEVICE (Right)  Anesthesia type: General  Patient location: PACU  Post pain: Pain level controlled and Adequate analgesia  Post assessment: Post-op Vital signs reviewed, Patient's Cardiovascular Status Stable, Respiratory Function Stable, Patent Airway and Pain level controlled  Last Vitals:  Filed Vitals:   03/08/12 1703  BP: 131/52  Pulse: 84  Temp: 36.9 C  Resp: 18    Post vital signs: Reviewed and stable  Level of consciousness: awake, alert  and oriented  Complications: No apparent anesthesia complications

## 2012-03-08 NOTE — Discharge Instructions (Signed)
[facility] °Office Phone Number [phone] ° °BREAST BIOPSY/ PARTIAL MASTECTOMY: POST OP INSTRUCTIONS ° °Always review your discharge instruction sheet given to you by the facility where your surgery was performed. ° °IF YOU HAVE DISABILITY OR FAMILY LEAVE FORMS, YOU MUST BRING THEM TO THE OFFICE FOR PROCESSING.  DO NOT GIVE THEM TO YOUR DOCTOR. ° °1. A prescription for pain medication may be given to you upon discharge.  Take your pain medication as prescribed, if needed.  If narcotic pain medicine is not needed, then you may take acetaminophen (Tylenol) or ibuprofen (Advil) as needed. °2. Take your usually prescribed medications unless otherwise directed °3. If you need a refill on your pain medication, please contact your pharmacy.  They will contact our office to request authorization.  Prescriptions will not be filled after 5pm or on week-ends. °4. You should eat very light the first 24 hours after surgery, such as soup, crackers, pudding, etc.  Resume your normal diet the day after surgery. °5. Most patients will experience some swelling and bruising in the breast.  Ice packs and a good support bra will help.  Swelling and bruising can take several days to resolve.  °6. It is common to experience some constipation if taking pain medication after surgery.  Increasing fluid intake and taking a stool softener will usually help or prevent this problem from occurring.  A mild laxative (Milk of Magnesia or Miralax) should be taken according to package directions if there are no bowel movements after 48 hours. °7. Unless discharge instructions indicate otherwise, you may remove your bandages 24-48 hours after surgery, and you may shower at that time.  You may have steri-strips (small skin tapes) in place directly over the incision.  These strips should be left on the skin for 7-10 days.  If your surgeon used skin glue on the incision, you may shower in 24 hours.  The glue will flake off over the  next 2-3 weeks.  Any sutures or staples will be removed at the office during your follow-up visit. °8. ACTIVITIES:  You may resume regular daily activities (gradually increasing) beginning the next day.  Wearing a good support bra or sports bra minimizes pain and swelling.  You may have sexual intercourse when it is comfortable. °a. You may drive when you no longer are taking prescription pain medication, you can comfortably wear a seatbelt, and you can safely maneuver your car and apply brakes. °b. RETURN TO WORK:  ______________________________________________________________________________________ °9. You should see your doctor in the office for a follow-up appointment approximately two weeks after your surgery.  Your doctor’s nurse will typically make your follow-up appointment when she calls you with your pathology report.  Expect your pathology report 2-3 business days after your surgery.  You may call to check if you do not hear from us after three days. °10. OTHER INSTRUCTIONS: _______________________________________________________________________________________________ _____________________________________________________________________________________________________________________________________ °_____________________________________________________________________________________________________________________________________ °_____________________________________________________________________________________________________________________________________ ° °WHEN TO CALL YOUR DOCTOR: °1. Fever over 101.0 °2. Nausea and/or vomiting. °3. Extreme swelling or bruising. °4. Continued bleeding from incision. °5. Increased pain, redness, or drainage from the incision. ° °The clinic staff is available to answer your questions during regular business hours.  Please don’t hesitate to call and ask to speak to one of the nurses for clinical concerns.  If you have a medical emergency, go to the nearest  emergency room or call 911.  A surgeon from [facility] is always on call at the hospital. ° °For further questions, please visit [email or web address]  ° ° °  Post Anesthesia Home Care Instructions ° °Activity: °Get plenty of rest for the remainder of the day. A responsible adult should stay with you for 24 hours following the procedure.  °For the next 24 hours, DO NOT: °-Drive a car °-Operate machinery °-Drink alcoholic beverages °-Take any medication unless instructed by your physician °-Make any legal decisions or sign important papers. ° °Meals: °Start with liquid foods such as gelatin or soup. Progress to regular foods as tolerated. Avoid greasy, spicy, heavy foods. If nausea and/or vomiting occur, drink only clear liquids until the nausea and/or vomiting subsides. Call your physician if vomiting continues. ° °Special Instructions/Symptoms: °Your throat may feel dry or sore from the anesthesia or the breathing tube placed in your throat during surgery. If this causes discomfort, gargle with warm salt water. The discomfort should disappear within 24 hours. ° °

## 2012-03-08 NOTE — Anesthesia Preprocedure Evaluation (Signed)
Anesthesia Evaluation  Patient identified by MRN, date of birth, ID band Patient awake    Reviewed: Allergy & Precautions, H&P , NPO status , Patient's Chart, lab work & pertinent test results, reviewed documented beta blocker date and time   History of Anesthesia Complications (+) PROLONGED EMERGENCE  Airway Mallampati: II TM Distance: >3 FB Neck ROM: full    Dental   Pulmonary neg pulmonary ROS,          Cardiovascular hypertension,     Neuro/Psych negative neurological ROS  negative psych ROS   GI/Hepatic negative GI ROS, Neg liver ROS,   Endo/Other  negative endocrine ROS  Renal/GU CRFRenal disease  negative genitourinary   Musculoskeletal   Abdominal   Peds  Hematology negative hematology ROS (+)   Anesthesia Other Findings See surgeon's H&P   Reproductive/Obstetrics negative OB ROS                           Anesthesia Physical Anesthesia Plan  ASA: III  Anesthesia Plan: General   Post-op Pain Management:    Induction: Intravenous  Airway Management Planned: LMA  Additional Equipment:   Intra-op Plan:   Post-operative Plan: Extubation in OR  Informed Consent: I have reviewed the patients History and Physical, chart, labs and discussed the procedure including the risks, benefits and alternatives for the proposed anesthesia with the patient or authorized representative who has indicated his/her understanding and acceptance.   Dental Advisory Given  Plan Discussed with: CRNA and Surgeon  Anesthesia Plan Comments:         Anesthesia Quick Evaluation

## 2012-03-08 NOTE — Op Note (Signed)
Preoperative Diagnosis: Right breast cancer  Postoprative Diagnosis: Right breast cancer  Procedure: Procedure(s): PARTIAL MASTECTOMY WITH AXILLARY SENTINEL LYMPH NODE BIOPSY AND NEEDLE LOCALIZATION, blue dye injection  Surgeon: Glenna Fellows T   Assistants: None  Anesthesia:  General LMA anesthesiaDiagnos  Indications:   Patient is a 78 year old female with multiple medical problems recently diagnosed with a 2 cm triple negative invasive breast cancer in the lower medial right breast. After extensive discussion of surgical treatment options we have elected to proceed with needle localized lumpectomy and sentinel lymph node biopsy. I discussed the procedure and risks of bleeding, infection, anesthetic complications, and possible need for further surgery based on final pathology. She understands and agrees to proceed.  Procedure Detail:  Following successful needle localization following injection of 1 mCi of technetium sulfur colloid in the skin of the right nipple preoperatively the patient was taken to the operating room and general LMA anesthesia was induced. Under sterile technique after patient timeout I injected 10 cc of dilute methylene blue subcutaneously beneath the right nipple and massaged this for several minutes. The entire right breast chest and upper arm and axilla were then widely sterilely prepped and draped. The lumpectomy was approached initially. A curvilinear incision was made the mass in the lower inner quadrant and dissection carried down to the subcutaneous tissue. Dissection widened and the needle brought into the incision. I then excised with cautery a generous specimen of breast tissue around the shaft and the tip of the needle. As the specimen was removed the mass was palpable and seemed relatively more close to the anterior and medial margins. The anterior margin was essentially the areolar skin and I did do a biopsy of subcutaneous tissue here is that this for  permanent section. I excised an additional 1 cm or so anteromedial margin which was oriented and sent for permanent section. The specimen mammogram showed the wire and clip in the specimen and this was inked and sent for permanent pathology. Attention was turned to the sentinel lymph node biopsy. A hot area in the right axilla was located with the neoprobe a small transverse incision made. Dissection was carried down to the subcutaneous tissue toward the clavipectoral fascia with cautery. The axilla was bluntly entered and using the neoprobe for guidance I dissected down to a slightly large bright blue lymph node with high counts. This was completely excised and ex vivo had counts of about 250 with a background of the axilla essentially 0. This was sent for permanent sections a hot blue right axillary sentinel lymph node. Both incisions were infiltrated with Marcaine without epinephrine and irrigated and complete hemostasis assured. In each incision the deep tissue was closed with interrupted 3-0 Vicryl and the skin with subcuticular 4-0 Monocryl and Dermabond. Sponge needle and management counts were correct.   Estimated Blood Loss:  Minimal         Drains: none  Blood Given: none          Specimens: #1 needle localized right breast lumpectomy #2 biopsy anterior margin #3 additional anteromedial margin, oriented #4 right axillary sentinel lymph node        Complications:  * No complications entered in OR log *         Disposition: PACU - hemodynamically stable.         Condition: stable   Mariella Saa MD, FACS  03/08/2012, 2:57 PM

## 2012-03-08 NOTE — Transfer of Care (Signed)
Immediate Anesthesia Transfer of Care Note  Patient: [name]  Procedure(s) Performed: Procedure(s) (LRB): PARTIAL MASTECTOMY WITH AXILLARY SENTINEL LYMPH NODE BIOPSY AND NEEDLE LOCALIZATION AND MAMMOSITE EVAL DEVICE (Right)  Patient Location: PACU  Anesthesia Type: General  Level of Consciousness: awake, alert , oriented and patient cooperative  Airway & Oxygen Therapy: Patient Spontanous Breathing and Patient connected to face mask oxygen  Post-op Assessment: Report given to PACU RN and Post -op Vital signs reviewed and stable  Post vital signs: Reviewed and stable  Complications: No apparent anesthesia complications

## 2012-03-08 NOTE — H&P (View-Only) (Signed)
Subjective:  new diagnosis cancer right breast  Patient ID: [name], female   DOB: 03/30/1931, 78 y.o.   MRN: [id number]  HPI Patient is a pleasant 78-year-old female referred by Dr. Cornella at [facility] for a new diagnosis of cancer of the right breast. The patient has a personal history of ovarian cancer. Her sister has also had ovarian cancer. They have been genetically tested in the past and by her history are BRCA negative. She has had yearly mammograms. Recent mammogram revealed a new mass in the right breast. Diagnostic mammogram revealed a multilobulated density in the lower inner quadrant of the right breast measuring a maximum of 3.5 cm. Right breast ultrasound revealed a hypoechoic mass measuring at least 2 cm. Large core needle biopsy was recommended and performed. This has revealed an ER/PR negative HER-2 negative grade 2 invasive ductal carcinoma. Due to renal disease she underwent gamma imaging instead of MRI which confirmed malignancy approximately 2 cm in the known area and no other abnormalities. The patient has significant renal insufficiency secondary to chemotherapy for her ovarian cancer and understandably absolutely refuses any further chemotherapy. She is here in the breast multidisciplinary clinic today to discuss treatment for her breast cancer.  Past Medical History  Diagnosis Date  . Anemia, chronic renal failure 10/08/2011  . Ovarian ca 10/08/2011  . Chronic kidney disease   . Gout   . Hypertension    Past Surgical History  Procedure Date  . Appendectomy   . Abdominal hysterectomy Ovarian CA   Current Outpatient Prescriptions  Medication Sig Dispense Refill  . alendronate (FOSAMAX) 10 MG tablet       . allopurinol (ZYLOPRIM) 100 MG tablet       . darbepoetin (ARANESP) 100 MCG/0.5ML SOLN Inject into the skin every 3 (three) months.      . losartan (COZAAR) 100 MG tablet       . SYNTHROID 25 MCG tablet       . ZEMPLAR 1 MCG capsule        No Known  Allergies   Review of Systems  Respiratory: Negative.   Cardiovascular: Negative.   Gastrointestinal: Negative.   Musculoskeletal: Positive for arthralgias.  Neurological: Positive for dizziness.       Objective:   Physical Exam General: Alert elderly Caucasian female Skin: Warm and dry no rash or infection HEENT: No palpable mass or thyromegaly. Sclera nonicteric. Lymph nodes: No palpable cervical, supraclavicular, or axillary nodes Breasts: There is bruising in the lower inner right breast. About 3 cm from the nipple at the 4 to 5:00 position is an approximately 2 cm freely movable mass. The skin or nipple changes. No discernible masses otherwise in either breast Lungs: Clear without wheezing or increased work of breathing Cardiovascular: Regular rate and rhythm. No murmurs. No edema. Extremities: She is limited extension of her right arm to about 30 from previous fracture. No edema. Neuro: Alert and fully oriented. Gait normal.     Assessment:     Diagnosis of triple negative invasive ductal carcinoma lower inner right breast., 2 cm by ultrasound but a 3.5 cm by mammogram. Exam would indicate closer to the smaller size. She has previous history of ovarian cancer but is genetically negative. She has chronic renal insufficiency secondary to chemotherapy and absolutely and understandably refuses any chemotherapy. We discussed surgical options including lumpectomy and mastectomy. We discussed there is no survival difference. She would need  breast radiation with lumpectomy and is amenable to this pending   discussion with radiation oncology.we discussed a sentinel lymph node biopsy. This would not change her treatment plan but she is very interested in staging and desires a sentinel lymph node biopsy which I think is reasonable.we discussed the nature of the surgery and risks of anesthetic complications, bleeding, infection, slight risk of lymphedema, and risk of needing further surgery up  to and including total mastectomy based on final pathology. She understands with her breast and the size of the tumor there could be some deformity. All of her and her husband's questions were answered    Plan:     Needle localized right breast lumpectomy with right axillary sentinel lymph node biopsy as an outpatient under general anesthesia pending discussion with radiation oncology.      

## 2012-03-13 ENCOUNTER — Telehealth (INDEPENDENT_AMBULATORY_CARE_PROVIDER_SITE_OTHER): Payer: Self-pay | Admitting: General Surgery

## 2012-03-13 NOTE — Telephone Encounter (Signed)
Please call the patient today with pathology results, she will be home after 5:00pm

## 2012-03-14 ENCOUNTER — Telehealth (INDEPENDENT_AMBULATORY_CARE_PROVIDER_SITE_OTHER): Payer: Self-pay

## 2012-03-14 NOTE — Telephone Encounter (Signed)
//  Pathology results given to patient, follow up appointment scheduled for 03/30/12 @ 9:45 am w/Dr. Johna Sheriff.

## 2012-03-14 NOTE — Progress Notes (Signed)
Pathology results given to patient.  Patient requested a copy be mailed to her as well.

## 2012-03-16 ENCOUNTER — Telehealth: Payer: Self-pay | Admitting: *Deleted

## 2012-03-16 NOTE — Telephone Encounter (Signed)
Spoke to pt concerning f/u appt with Dr. Darnelle Catalan on 6/20.  Pt relate she will come and speak to him.  Pt denies further needs at this time.

## 2012-03-22 ENCOUNTER — Encounter: Payer: Self-pay | Admitting: Radiation Oncology

## 2012-03-22 ENCOUNTER — Telehealth: Payer: Self-pay | Admitting: Oncology

## 2012-03-22 ENCOUNTER — Ambulatory Visit
Admission: RE | Admit: 2012-03-22 | Discharge: 2012-03-22 | Disposition: A | Discharge status: Home / Self Care | Payer: Medicare Other | Source: Ambulatory Visit | Attending: Radiation Oncology | Admitting: Radiation Oncology

## 2012-03-22 ENCOUNTER — Other Ambulatory Visit: Payer: Self-pay | Admitting: Oncology

## 2012-03-22 VITALS — BP 131/75 | HR 94 | Temp 98.3°F | Wt 143.5 lb

## 2012-03-22 DIAGNOSIS — C50319 Malignant neoplasm of lower-inner quadrant of unspecified female breast: Secondary | ICD-10-CM

## 2012-03-22 DIAGNOSIS — Z510 Encounter for antineoplastic radiation therapy: Secondary | ICD-10-CM | POA: Insufficient documentation

## 2012-03-22 DIAGNOSIS — Z79899 Other long term (current) drug therapy: Secondary | ICD-10-CM | POA: Insufficient documentation

## 2012-03-22 NOTE — Telephone Encounter (Signed)
S/w the pt and she is aware of her July and aug 2013 appts and the cancelled June appt.

## 2012-03-22 NOTE — Progress Notes (Signed)
Radiation Oncology         [phone] ________________________________  Name: [name] MRN: [id number]  Date: 03/22/2012  DOB: 1931/05/26  Follow-Up Visit Note  CC: Minda Meo, MD  Hoxworth, Lorne Skeens, MD  Diagnosis:   pT2N0; cT2N0M0 lower inner quadrant right breast triple negative invasive ductal carcinoma. Grade 3    Narrative:  The patient returns today for routine follow-up.  She was discussed this morning at our multidisciplinary tumor board. She underwent a lumpectomy and sentinel lymph node biopsy on 03/08/2012. This demonstrated 2.5 cm tumor, grade 3, invasive ductal carcinoma, with 0 out of one positive sentinel lymph nodes. Her disease is triple negative with a Ki-67 of 75%. The inferior margin is close at 0.1 cm.  She is recovering well from surgery. She does not intend to receive chemotherapy. I discussed this personally with Dr. Darnelle Catalan.     She will undergo genetic testing in August.           ALLERGIES:  is allergic to nsaids and renal.  Meds: Current Outpatient Prescriptions  Medication Sig Dispense Refill  . alendronate (FOSAMAX) 10 MG tablet Take 10 mg by mouth daily before breakfast.       . allopurinol (ZYLOPRIM) 100 MG tablet Take 100 mg by mouth daily.       Marland Kitchen aspirin 81 MG tablet Take 81 mg by mouth daily.      . darbepoetin (ARANESP) 100 MCG/0.5ML SOLN Inject into the skin every 3 (three) months.      Marland Kitchen HYDROcodone-acetaminophen (NORCO) 5-325 MG per tablet       . losartan (COZAAR) 50 MG tablet Take 50 mg by mouth daily.      Marland Kitchen SYNTHROID 25 MCG tablet Take 25 mcg by mouth daily.       Marland Kitchen ZEMPLAR 1 MCG capsule 1 tab every other day        Physical Findings: The patient is in no acute distress. Patient is alert and oriented. She appears younger than her stated age. Blood pressure 131/75, pulse 94, temperature 98 degrees weight 143 pounds.  She is in no acute distress. The lumpectomy scar and axillary scar have healed well over the right breast  without any significant seromas.  Lab Findings: Lab Results  Component Value Date   WBC 6.2 02/16/2012   HGB 11.6* 03/08/2012   HCT 34.0* 03/08/2012   MCV 98.6 02/16/2012   PLT 174 02/16/2012    CMP     Component Value Date/Time   NA 137 03/08/2012 1206   K 5.5* 03/08/2012 1206   CL 113* 03/08/2012 1206   CO2 24 03/06/2012 0935   GLUCOSE 95 03/08/2012 1206   BUN 73* 03/08/2012 1206   CREATININE 3.50* 03/08/2012 1206   CALCIUM 9.5 03/06/2012 0935   CALCIUM 9.5 04/05/2011 0844   PROT 7.2 02/16/2012 1210   ALBUMIN 3.7 02/16/2012 1210   AST 23 02/16/2012 1210   ALT 20 02/16/2012 1210   ALKPHOS 83 02/16/2012 1210   BILITOT 0.3 02/16/2012 1210   GFRNONAA 13* 03/06/2012 0935   GFRAA 15* 03/06/2012 0935      Radiographic Findings: Nm Sentinel Node Inj-no Rpt (breast)  03/08/2012  CLINICAL DATA: cancer of the breast   Sulfur colloid was injected intradermally by the nuclear medicine  technologist for breast cancer sentinel node localization.      Impression/Plan: Doing well postoperatively. We've once again discussed adjuvant radiotherapy and the risks benefits and side effects associated with this. A consent form  has been signed and placed in her chart. She seems very comfortable with the plan. I told her that her radiation will be given over approximately 4-6 weeks, depending on the measurements taken after simulation. We will simulate her on Friday, June 28 and anticipate giving her first fraction of radiotherapy on July 8. She is enthusiastic about proceeding.  -----------------------------------  [name], MD

## 2012-03-22 NOTE — Progress Notes (Signed)
I met with [name] while she was in the radiation oncology clinic today. She has decided to forego chemotherapy. We have discussed this extensively and she has a very good understanding of what is involved. I am not comfortable with her decision.  She will see [place] July 1 just for her iron as the shots, then she will see me in August of an at that point we will initiate long-term followup. She knows to call for any problems that may develop before that visit.

## 2012-03-22 NOTE — Addendum Note (Signed)
Encounter addended by: Delynn Flavin, RN on: 03/22/2012  6:13 PM<BR>     Documentation filed: Charges VN

## 2012-03-23 ENCOUNTER — Ambulatory Visit: Payer: Medicare Other | Admitting: Oncology

## 2012-03-30 ENCOUNTER — Encounter (INDEPENDENT_AMBULATORY_CARE_PROVIDER_SITE_OTHER): Payer: Self-pay | Admitting: General Surgery

## 2012-03-30 ENCOUNTER — Ambulatory Visit (INDEPENDENT_AMBULATORY_CARE_PROVIDER_SITE_OTHER): Payer: Medicare Other | Admitting: General Surgery

## 2012-03-30 VITALS — BP 110/82 | HR 96 | Temp 97.2°F | Resp 18 | Ht 69.0 in | Wt 142.0 lb

## 2012-03-30 DIAGNOSIS — C50319 Malignant neoplasm of lower-inner quadrant of unspecified female breast: Secondary | ICD-10-CM

## 2012-03-30 NOTE — Progress Notes (Signed)
History: Patient returns to the office following her right breast lumpectomy and sentinel lymph node biopsy. She's getting along well and not having any pain or other complaints.  We reviewed her pathology. As noted she has a triple negative for tumor which was 2.5 cm in diameter. All margins were negative taking into account the additional medial margin was sent but had no tumor. Her sentinel lymph node was negative.  At this point she does not want chemotherapy due to her severe chronic renal insufficiency. Radiation planning is scheduled for next week.  Assessment and plan: Doing well following lumpectomy and sentinel lymph node biopsy. I will see her for long-term follow up in 6 months.

## 2012-03-31 ENCOUNTER — Ambulatory Visit
Admission: RE | Admit: 2012-03-31 | Discharge: 2012-03-31 | Disposition: A | Discharge status: Home / Self Care | Payer: Medicare Other | Source: Ambulatory Visit | Attending: Radiation Oncology | Admitting: Radiation Oncology

## 2012-03-31 DIAGNOSIS — C50319 Malignant neoplasm of lower-inner quadrant of unspecified female breast: Secondary | ICD-10-CM

## 2012-03-31 NOTE — Progress Notes (Signed)
Met with patient to discuss RO billing.   Dx: 174.3 Lower-inner quadrant of breast   Attending Rad: Dr. Paulino Rily Tx: [id number] Extrl Beam

## 2012-03-31 NOTE — Progress Notes (Signed)
SIMULATION / TREATMENT PLANNING NOTE:   Diagnosis: Right breast cancer  The patient was taken to the CT simulator and laid in the supine position, arms over her head, and her head in an Accuform device.  High resolution CT axial imaging was obtained of the patient's right breast and chest.  An isocenter was placed in the anterior right lung.   I plan to treat the patient's right breast with opposed tangents, using MLCs and wedges as needed for custom blocks, to a dose of 4005 cGy/15 fractions.  This will eventually be followed by a boost of 1000 centigray in 5 fractions to the lumpectomy cavity with electrons.

## 2012-04-03 ENCOUNTER — Other Ambulatory Visit: Payer: Self-pay | Admitting: *Deleted

## 2012-04-03 ENCOUNTER — Telehealth: Payer: Self-pay | Admitting: Oncology

## 2012-04-03 ENCOUNTER — Ambulatory Visit: Payer: Medicare Other | Admitting: Lab

## 2012-04-03 ENCOUNTER — Ambulatory Visit: Payer: Medicare Other

## 2012-04-03 ENCOUNTER — Other Ambulatory Visit: Payer: Self-pay | Admitting: Oncology

## 2012-04-03 ENCOUNTER — Encounter: Payer: Self-pay | Admitting: *Deleted

## 2012-04-03 DIAGNOSIS — C50319 Malignant neoplasm of lower-inner quadrant of unspecified female breast: Secondary | ICD-10-CM

## 2012-04-03 DIAGNOSIS — N189 Chronic kidney disease, unspecified: Secondary | ICD-10-CM

## 2012-04-03 LAB — CBC WITH DIFFERENTIAL/PLATELET
BASO%: 0.2 % (ref 0.0–2.0)
Eosinophils Absolute: 0.3 10*3/uL (ref 0.0–0.5)
LYMPH%: 21.9 % (ref 14.0–49.7)
MCHC: 33.3 g/dL (ref 31.5–36.0)
MCV: 99.3 fL (ref 79.5–101.0)
MONO%: 8 % (ref 0.0–14.0)
Platelets: 216 10*3/uL (ref 145–400)
RBC: 3.33 10*6/uL — ABNORMAL LOW (ref 3.70–5.45)

## 2012-04-03 MED ORDER — DARBEPOETIN ALFA-POLYSORBATE 500 MCG/ML IJ SOLN: 300.0000 ug | Freq: Once | INTRAMUSCULAR | Status: DC

## 2012-04-03 NOTE — Telephone Encounter (Signed)
Pt is aware to pick up her July-nov appt calendars

## 2012-04-03 NOTE — Progress Notes (Signed)
Spoke with patient today who does not verbalize any problems at this time, states she is doing fine.  Appointment calendar given, however,she stated she already has one and having two would confuse her. She prefers to keep previous version.   She knows to call with questions/concerns.

## 2012-04-07 ENCOUNTER — Ambulatory Visit
Admission: RE | Admit: 2012-04-07 | Discharge: 2012-04-07 | Disposition: A | Discharge status: Home / Self Care | Payer: Medicare Other | Source: Ambulatory Visit | Attending: Radiation Oncology | Admitting: Radiation Oncology

## 2012-04-07 ENCOUNTER — Other Ambulatory Visit (HOSPITAL_COMMUNITY): Payer: Medicare Other

## 2012-04-07 DIAGNOSIS — C50319 Malignant neoplasm of lower-inner quadrant of unspecified female breast: Secondary | ICD-10-CM

## 2012-04-10 ENCOUNTER — Ambulatory Visit
Admission: RE | Admit: 2012-04-10 | Discharge: 2012-04-10 | Disposition: A | Discharge status: Home / Self Care | Payer: Medicare Other | Source: Ambulatory Visit | Attending: Radiation Oncology | Admitting: Radiation Oncology

## 2012-04-10 ENCOUNTER — Other Ambulatory Visit: Payer: Medicare Other | Admitting: Lab

## 2012-04-10 ENCOUNTER — Encounter: Payer: Self-pay | Admitting: Radiation Oncology

## 2012-04-10 ENCOUNTER — Ambulatory Visit: Payer: Medicare Other | Admitting: Hematology & Oncology

## 2012-04-10 VITALS — BP 124/76 | HR 99 | Resp 18 | Wt 142.8 lb

## 2012-04-10 DIAGNOSIS — C50319 Malignant neoplasm of lower-inner quadrant of unspecified female breast: Secondary | ICD-10-CM

## 2012-04-10 NOTE — Progress Notes (Signed)
VERIFICATION SIMULATION NOTE  date of service 04-07-12  The patient was laid in the correct position on the treatment table for simulation verification. Portal imaging was obtained and I verified the fields and MLCs for her right breast radiotherapy to be accurate. The patient tolerated the procedure well.  -----------------------------------------------------  [name], MD

## 2012-04-10 NOTE — Progress Notes (Signed)
Patient presents to the clinic today unaccompanied for a PUT with Dr. Basilio Cairo. Patient is alert and oriented to person, place, and time. No distress noted. Steady gait noted. Pleasant affect noted. Patient denies pain at this time. Today is the initial treatment therefore, no skin changes noted. Reported all findings to Dr. Basilio Cairo.

## 2012-04-10 NOTE — Progress Notes (Signed)
   Weekly Management Note T2 N0 right breast cancer Current Dose:   267cGy  Projected Dose:  5005cGy   Narrative:  The patient presents for routine under treatment assessment.  CBCT/MVCT images/Port film x-rays were reviewed.  The chart was checked. Doing well with no complaints  Physical Findings:  weight is 142 lb 12.8 oz (64.774 kg). Her blood pressure is 124/76 and her pulse is 99. Her respiration is 18.  Skin exam deferred today No acute distress  Impression:  The patient is tolerating radiotherapy.  Plan:  Continue radiotherapy as planned.  ________________________________   [name], M.D.

## 2012-04-11 ENCOUNTER — Ambulatory Visit
Admission: RE | Admit: 2012-04-11 | Discharge: 2012-04-11 | Disposition: A | Discharge status: Home / Self Care | Payer: Medicare Other | Source: Ambulatory Visit | Attending: Radiation Oncology | Admitting: Radiation Oncology

## 2012-04-12 ENCOUNTER — Ambulatory Visit
Admission: RE | Admit: 2012-04-12 | Discharge: 2012-04-12 | Disposition: A | Discharge status: Home / Self Care | Payer: Medicare Other | Source: Ambulatory Visit | Attending: Radiation Oncology | Admitting: Radiation Oncology

## 2012-04-13 ENCOUNTER — Ambulatory Visit
Admission: RE | Admit: 2012-04-13 | Discharge: 2012-04-13 | Disposition: A | Discharge status: Home / Self Care | Payer: Medicare Other | Source: Ambulatory Visit | Attending: Radiation Oncology | Admitting: Radiation Oncology

## 2012-04-14 ENCOUNTER — Ambulatory Visit
Admission: RE | Admit: 2012-04-14 | Discharge: 2012-04-14 | Disposition: A | Discharge status: Home / Self Care | Payer: Medicare Other | Source: Ambulatory Visit | Attending: Radiation Oncology | Admitting: Radiation Oncology

## 2012-04-14 ENCOUNTER — Encounter: Payer: Self-pay | Admitting: Radiation Oncology

## 2012-04-17 ENCOUNTER — Ambulatory Visit
Admission: RE | Admit: 2012-04-17 | Discharge: 2012-04-17 | Disposition: A | Discharge status: Home / Self Care | Payer: Medicare Other | Source: Ambulatory Visit | Attending: Radiation Oncology | Admitting: Radiation Oncology

## 2012-04-17 ENCOUNTER — Encounter: Payer: Self-pay | Admitting: Radiation Oncology

## 2012-04-17 VITALS — BP 130/78 | HR 97 | Wt 141.8 lb

## 2012-04-17 DIAGNOSIS — C50319 Malignant neoplasm of lower-inner quadrant of unspecified female breast: Secondary | ICD-10-CM

## 2012-04-17 MED ORDER — ALRA NON-METALLIC DEODORANT (RAD-ONC)
1.0000 "application " | Freq: Once | TOPICAL | Status: AC
  Administered 2012-04-17: 1 via TOPICAL

## 2012-04-17 MED ORDER — RADIAPLEXRX EX GEL
Freq: Once | CUTANEOUS | Status: AC
  Administered 2012-04-17: 1 via TOPICAL

## 2012-04-17 NOTE — Progress Notes (Signed)
   Weekly Management Note, T2 N0 right breast cancer Current Dose:   16.02 Gy  Projected Dose: 50.05 Gy   Narrative:  The patient presents for routine under treatment assessment.  CBCT/MVCT images/Port film x-rays were reviewed.  The chart was checked. doing well without any complaints  Physical Findings:  weight is 141 lb 12.8 oz (64.32 kg). Her blood pressure is 130/78 and her pulse is 97. no obvious skin changes thus far over her breast.  Impression:  The patient is tolerating radiotherapy.  Plan:  Continue radiotherapy as planned.  ________________________________   [name], M.D.

## 2012-04-17 NOTE — Progress Notes (Addendum)
6/15 fractions to right Breast.   No voiced concerns.  No skin changes noted today.  Post Sim education to include management of skin, pain and fatigue. Reviewed daily check and schedule.  Given business card to contact this RN if needed during working hours and Given Radiaton Therapy and You booklet.

## 2012-04-18 ENCOUNTER — Ambulatory Visit
Admission: RE | Admit: 2012-04-18 | Discharge: 2012-04-18 | Disposition: A | Discharge status: Home / Self Care | Payer: Medicare Other | Source: Ambulatory Visit | Attending: Radiation Oncology | Admitting: Radiation Oncology

## 2012-04-19 ENCOUNTER — Ambulatory Visit
Admission: RE | Admit: 2012-04-19 | Discharge: 2012-04-19 | Disposition: A | Discharge status: Home / Self Care | Payer: Medicare Other | Source: Ambulatory Visit | Attending: Radiation Oncology | Admitting: Radiation Oncology

## 2012-04-20 ENCOUNTER — Encounter (INDEPENDENT_AMBULATORY_CARE_PROVIDER_SITE_OTHER): Payer: Self-pay

## 2012-04-20 ENCOUNTER — Ambulatory Visit
Admission: RE | Admit: 2012-04-20 | Discharge: 2012-04-20 | Disposition: A | Discharge status: Home / Self Care | Payer: Medicare Other | Source: Ambulatory Visit | Attending: Radiation Oncology | Admitting: Radiation Oncology

## 2012-04-21 ENCOUNTER — Ambulatory Visit
Admission: RE | Admit: 2012-04-21 | Discharge: 2012-04-21 | Disposition: A | Discharge status: Home / Self Care | Payer: Medicare Other | Source: Ambulatory Visit | Attending: Radiation Oncology | Admitting: Radiation Oncology

## 2012-04-24 ENCOUNTER — Ambulatory Visit
Admission: RE | Admit: 2012-04-24 | Discharge: 2012-04-24 | Disposition: A | Discharge status: Home / Self Care | Payer: Medicare Other | Source: Ambulatory Visit | Attending: Radiation Oncology | Admitting: Radiation Oncology

## 2012-04-24 ENCOUNTER — Encounter: Payer: Self-pay | Admitting: Radiation Oncology

## 2012-04-24 VITALS — BP 145/84 | HR 104 | Resp 18 | Wt 139.7 lb

## 2012-04-24 DIAGNOSIS — C50319 Malignant neoplasm of lower-inner quadrant of unspecified female breast: Secondary | ICD-10-CM

## 2012-04-24 NOTE — Progress Notes (Signed)
Weekly Management Note:  Site:R Breast Current Dose:  2937  cGy Projected Dose: 2937  cGy  Narrative: The patient is seen today for routine under treatment assessment. CBCT/MVCT images/port films were reviewed. The chart was reviewed.   No complaints today although she does admit to a 4 pound weight loss over the past month. She uses Radioplex gel.  Physical Examination:  Filed Vitals:   04/24/12 1149  BP: 145/84  Pulse: 104  Resp: 18  .  Weight: 139 lb 11.2 oz (63.368 kg).  There is mild erythema along the right breast with no areas of desquamation.  Impression: Tolerating radiation therapy well. I'm unsure as to the etiology of her weight loss, but I told her that she may want to speak with one of her nephrologist to see if there is an appropriate diet supplement that she could take.  Plan: Continue radiation therapy as planned.

## 2012-04-24 NOTE — Progress Notes (Signed)
Patient presents to the clinic today unaccompanied for an under treat visit with Dr. Dayton Scrape. Patient is alert and oriented to person, place, and time. No distress noted. Steady gait noted. Pleasant affect noted. Patient denies breast pain at this time. Patient denies skin changes to right/treated breast. Patient reports using Radiaplex as directed. A slow steady decline of weight concerns the patient. Since surgery the patient has lost 14 lb despite a good appetite. Patient reports a non productive persistent cough but, denies other symptoms. Reported all findings to Dr. Dayton Scrape.

## 2012-04-25 ENCOUNTER — Ambulatory Visit
Admission: RE | Admit: 2012-04-25 | Discharge: 2012-04-25 | Disposition: A | Discharge status: Home / Self Care | Payer: Medicare Other | Source: Ambulatory Visit | Attending: Radiation Oncology | Admitting: Radiation Oncology

## 2012-04-26 ENCOUNTER — Ambulatory Visit
Admission: RE | Admit: 2012-04-26 | Discharge: 2012-04-26 | Disposition: A | Discharge status: Home / Self Care | Payer: Medicare Other | Source: Ambulatory Visit | Attending: Radiation Oncology | Admitting: Radiation Oncology

## 2012-04-27 ENCOUNTER — Ambulatory Visit
Admission: RE | Admit: 2012-04-27 | Discharge: 2012-04-27 | Disposition: A | Discharge status: Home / Self Care | Payer: Medicare Other | Source: Ambulatory Visit | Attending: Radiation Oncology | Admitting: Radiation Oncology

## 2012-04-28 ENCOUNTER — Ambulatory Visit
Admission: RE | Admit: 2012-04-28 | Discharge: 2012-04-28 | Disposition: A | Discharge status: Home / Self Care | Payer: Medicare Other | Source: Ambulatory Visit | Attending: Radiation Oncology | Admitting: Radiation Oncology

## 2012-05-01 ENCOUNTER — Ambulatory Visit
Admission: RE | Admit: 2012-05-01 | Discharge: 2012-05-01 | Disposition: A | Discharge status: Home / Self Care | Payer: Medicare Other | Source: Ambulatory Visit | Admitting: Radiation Oncology

## 2012-05-01 ENCOUNTER — Ambulatory Visit
Admission: RE | Admit: 2012-05-01 | Discharge: 2012-05-01 | Disposition: A | Discharge status: Home / Self Care | Payer: Medicare Other | Source: Ambulatory Visit | Attending: Radiation Oncology | Admitting: Radiation Oncology

## 2012-05-01 ENCOUNTER — Ambulatory Visit: Admission: RE | Admit: 2012-05-01 | Payer: Medicare Other | Source: Ambulatory Visit | Admitting: Radiation Oncology

## 2012-05-01 ENCOUNTER — Ambulatory Visit
Admission: RE | Admit: 2012-05-01 | Discharge: 2012-05-01 | Payer: Medicare Other | Source: Ambulatory Visit | Attending: Radiation Oncology | Admitting: Radiation Oncology

## 2012-05-01 DIAGNOSIS — C50319 Malignant neoplasm of lower-inner quadrant of unspecified female breast: Secondary | ICD-10-CM

## 2012-05-01 NOTE — Progress Notes (Signed)
   Weekly Management Note Current Dose:  42.05 Gy  Projected Dose: 50.05 Gy   Narrative:  The patient presents for routine under treatment assessment.  CBCT/MVCT images/Port film x-rays were reviewed.  The chart was checked. She is doing well with minimal skin irritation. She had several questions today. She is concerned about her kidney function in whether will be affected by the radiotherapy. She has questions about hypofractionated radiotherapy.  Physical Findings:  vitals were not taken for this visit. her right breast demonstrates early erythema with no desquamation.  Impression:  The patient is tolerating radiotherapy.  Plan:  Continue radiotherapy as planned. I spent quite a bit of time talking to the patient about her radiotherapy. She understands that her kidney function should not be affected by the radiotherapy as her kidneys are not within the radiation portals. All questions were answered about hypofractionated radiotherapy. She understands that she will be starting her electron boost today. She'll complete radiotherapy at the end of the week.   ________________________________   [name], M.D.

## 2012-05-02 ENCOUNTER — Ambulatory Visit
Admission: RE | Admit: 2012-05-02 | Discharge: 2012-05-02 | Disposition: A | Discharge status: Home / Self Care | Payer: Medicare Other | Source: Ambulatory Visit | Attending: Radiation Oncology | Admitting: Radiation Oncology

## 2012-05-03 ENCOUNTER — Ambulatory Visit
Admission: RE | Admit: 2012-05-03 | Discharge: 2012-05-03 | Disposition: A | Discharge status: Home / Self Care | Payer: Medicare Other | Source: Ambulatory Visit | Attending: Radiation Oncology | Admitting: Radiation Oncology

## 2012-05-04 ENCOUNTER — Ambulatory Visit
Admission: RE | Admit: 2012-05-04 | Discharge: 2012-05-04 | Disposition: A | Discharge status: Home / Self Care | Payer: Medicare Other | Source: Ambulatory Visit | Attending: Radiation Oncology | Admitting: Radiation Oncology

## 2012-05-05 ENCOUNTER — Encounter: Payer: Self-pay | Admitting: Radiation Oncology

## 2012-05-05 ENCOUNTER — Ambulatory Visit
Admission: RE | Admit: 2012-05-05 | Discharge: 2012-05-05 | Disposition: A | Discharge status: Home / Self Care | Payer: Medicare Other | Source: Ambulatory Visit | Attending: Radiation Oncology | Admitting: Radiation Oncology

## 2012-05-05 DIAGNOSIS — C50319 Malignant neoplasm of lower-inner quadrant of unspecified female breast: Secondary | ICD-10-CM

## 2012-05-05 NOTE — Progress Notes (Deleted)
°  Radiation Oncology         [phone] ________________________________  Name: RICHIE VADALA MRN: [id number]  Date: 05/05/2012  DOB: [birth year]  End of Treatment Note  Diagnosis:  pT2, N0, M0 right breast lower inner quadrant invasive ductal carcinoma. High-grade ER/PR negative HER-2/neu negative Ki-67 75%  Indication for treatment:  Curative         Radiation treatment dates:  04/10/2012-05/05/2012  Site/dose:   1)Right Breast / 50Gy/ 25 fractions 2) Right Breast Boost / 10 Gy/ 5 fractions  Beams/energy:  1) Opposed tangents / 6 MV photons 2) En face Electrons / 15 MeV electrons  Narrative: Ms. Forlenza tolerated radiation treatment relatively well.    Plan: The patient has completed radiation treatment. The patient will return to radiation oncology clinic for routine followup in one month. I advised them to call or return sooner if they have any questions or concerns related to their recovery or treatment.  -----------------------------------  [name], MD

## 2012-05-05 NOTE — Progress Notes (Signed)
   Department of Radiation Oncology  Phone:  [phone] Fax:        [phone]  Weekly Treatment Note    Name: [name] Date: 05/05/2012 MRN: [id number] DOB: Nov 13, 1930   Current dose: 50.05 Gy  Current fraction: 20   MEDICATIONS: Current Outpatient Prescriptions  Medication Sig Dispense Refill  . alendronate (FOSAMAX) 10 MG tablet Take 10 mg by mouth daily before breakfast.       . allopurinol (ZYLOPRIM) 100 MG tablet Take 100 mg by mouth daily.       Marland Kitchen aspirin 81 MG tablet Take 81 mg by mouth daily.      . darbepoetin (ARANESP) 100 MCG/0.5ML SOLN Inject into the skin every 3 (three) months.      . hyaluronate sodium (RADIAPLEXRX) GEL Apply 1 application topically 2 (two) times daily. Apply to Breast BID, after treatment and at bedtime      . HYDROcodone-acetaminophen (NORCO) 5-325 MG per tablet       . losartan (COZAAR) 50 MG tablet Take 50 mg by mouth daily.      . non-metallic deodorant Thornton Papas) MISC Apply 1 application topically daily as needed.      Marland Kitchen SYNTHROID 25 MCG tablet Take 25 mcg by mouth daily.       Marland Kitchen ZEMPLAR 1 MCG capsule 1 tab every other day         ALLERGIES: Nsaids and Renal   LABORATORY DATA:  Lab Results  Component Value Date   WBC 6.6 04/03/2012   HGB 11.0* 04/03/2012   HCT 33.1* 04/03/2012   MCV 99.3 04/03/2012   PLT 216 04/03/2012   Lab Results  Component Value Date   NA 137 03/08/2012   K 5.5* 03/08/2012   CL 113* 03/08/2012   CO2 24 03/06/2012   Lab Results  Component Value Date   ALT 20 02/16/2012   AST 23 02/16/2012   ALKPHOS 83 02/16/2012   BILITOT 0.3 02/16/2012     NARRATIVE: [name] was seen today for weekly treatment management. The chart was checked and the patient's films were reviewed. The patient finished her final fraction of radiotherapy today. She states that she has done very well. No complaints. She denies any significant skin irritation.  PHYSICAL EXAMINATION: vitals were not taken for this visit.     patient's skin looks  very good for finishing treatment. Some radiation induced skin change is present but this is quite mild to moderate. No desquamation.  ASSESSMENT: The patient did satisfactorily with treatment.  PLAN: Followup with Dr. Basilio Cairo as has been scheduled.

## 2012-05-05 NOTE — Progress Notes (Deleted)
Name: [name]  MRN: [id number]  Date:  04/14/2012   DOB: [birth year]  Status:outpatient    DIAGNOSIS: Breast cancer.  CONSENT VERIFIED: yes   SET UP: Patient is setup supine   IMMOBILIZATION:  The following immobilization was used:Custom Moldable Pillow, breast board.   NARRATIVE: [name] underwent complex simulation and treatment planning for her boost treatment today.  Her tumor volume was outlined on the planning CT scan. The depth of her cavity was measured.    15  MeV electrons will be prescribed to the 100% Isodose line. Her dose will be 10 Gy in 5 fractions to the lumpectomy cavity.  A custom electron cut-out block will be used for beam modification purposes.  A special port plan is requested.

## 2012-05-05 NOTE — Progress Notes (Signed)
Completes treatment to right Breast.  C/o Fatigue today. Some rash-like appearance to right, inner upper breast with faint erythema in the inframmary fold.  No complaints of pain.

## 2012-05-08 ENCOUNTER — Ambulatory Visit: Payer: Medicare Other

## 2012-05-09 ENCOUNTER — Ambulatory Visit: Payer: Medicare Other

## 2012-05-10 ENCOUNTER — Ambulatory Visit: Payer: Medicare Other

## 2012-05-11 ENCOUNTER — Ambulatory Visit: Payer: Medicare Other

## 2012-05-12 ENCOUNTER — Ambulatory Visit: Payer: Medicare Other

## 2012-05-15 ENCOUNTER — Other Ambulatory Visit (HOSPITAL_BASED_OUTPATIENT_CLINIC_OR_DEPARTMENT_OTHER): Payer: Medicare Other | Admitting: Lab

## 2012-05-15 ENCOUNTER — Ambulatory Visit: Payer: Medicare Other

## 2012-05-15 ENCOUNTER — Ambulatory Visit (HOSPITAL_BASED_OUTPATIENT_CLINIC_OR_DEPARTMENT_OTHER): Payer: Medicare Other

## 2012-05-15 VITALS — BP 138/79 | HR 99 | Temp 98.6°F

## 2012-05-15 DIAGNOSIS — D631 Anemia in chronic kidney disease: Secondary | ICD-10-CM

## 2012-05-15 DIAGNOSIS — N189 Chronic kidney disease, unspecified: Secondary | ICD-10-CM

## 2012-05-15 DIAGNOSIS — N039 Chronic nephritic syndrome with unspecified morphologic changes: Secondary | ICD-10-CM

## 2012-05-15 LAB — CBC WITH DIFFERENTIAL/PLATELET
Basophils Absolute: 0 10*3/uL (ref 0.0–0.1)
Eosinophils Absolute: 0.4 10*3/uL (ref 0.0–0.5)
HCT: 31.8 % — ABNORMAL LOW (ref 34.8–46.6)
HGB: 10.4 g/dL — ABNORMAL LOW (ref 11.6–15.9)
MCH: 32.7 pg (ref 25.1–34.0)
MONO#: 0.5 10*3/uL (ref 0.1–0.9)
NEUT#: 4.2 10*3/uL (ref 1.5–6.5)
NEUT%: 72.6 % (ref 38.4–76.8)
WBC: 5.8 10*3/uL (ref 3.9–10.3)
lymph#: 0.7 10*3/uL — ABNORMAL LOW (ref 0.9–3.3)

## 2012-05-15 MED ORDER — DARBEPOETIN ALFA-POLYSORBATE 500 MCG/ML IJ SOLN
300.0000 ug | Freq: Once | INTRAMUSCULAR | Status: AC
  Administered 2012-05-15: 300 ug via SUBCUTANEOUS
  Filled 2012-05-15: qty 1

## 2012-05-22 ENCOUNTER — Ambulatory Visit: Payer: Medicare Other

## 2012-05-22 ENCOUNTER — Ambulatory Visit (HOSPITAL_BASED_OUTPATIENT_CLINIC_OR_DEPARTMENT_OTHER): Payer: Medicare Other | Admitting: Oncology

## 2012-05-22 VITALS — BP 150/83 | HR 109 | Temp 98.2°F | Resp 18 | Ht 69.0 in | Wt 138.4 lb

## 2012-05-22 DIAGNOSIS — Z8543 Personal history of malignant neoplasm of ovary: Secondary | ICD-10-CM

## 2012-05-22 DIAGNOSIS — N289 Disorder of kidney and ureter, unspecified: Secondary | ICD-10-CM

## 2012-05-22 DIAGNOSIS — D649 Anemia, unspecified: Secondary | ICD-10-CM

## 2012-05-22 DIAGNOSIS — C50319 Malignant neoplasm of lower-inner quadrant of unspecified female breast: Secondary | ICD-10-CM

## 2012-05-22 DIAGNOSIS — N189 Chronic kidney disease, unspecified: Secondary | ICD-10-CM

## 2012-05-22 NOTE — Progress Notes (Signed)
.[name]   DOB: 02-24-1931  MR#: [id number]  CSN#:[id number]  HISTORY OF PRESENT ILLNESS: The patient had routine annual mammography at [facility] 02/02/2012. There was a developing density in the inferior aspect of the right breast and on may 02/07/2012 the patient was brought back for additional views. This confirmed a new multilobulated density in the lower inner quadrant measuring 3.5 cm. By ultrasound this was multilobulated and hypoechoic. It measured at least 2 cm. Biopsy of this mass was obtained the the next day, and showed ([id number]) and invasive ductal carcinoma, grade 2 or 3, which was triple negative, with an MIB-1 of 75%.  Breast specific gamma imaging was obtained Feb 14 2012 and showed only a 2 cm focus of activity in the region of prior biopsy. The patient was evaluated at the multidisciplinary breast cancer clinic on 02/16/2012. Her subsequent history is as detailed below.  INTERVAL HISTORY: [name] returns today for followup of her breast cancer. Since her last visit here she completed her radiation treatments. She also met with me during one of those treatments and we discussed her situation. She decided she did not want to proceed to adjuvant chemotherapy. I am not uncomfortable with that decision, given her overall situation.  REVIEW OF SYSTEMS: She still somewhat fatigued from the radiation but this is a bit better. She has a sore throat, which preceded her surgery, but got worse of course when she was intubated. That is improving. She tried some Magic mouthwash for this which didn't help. I suggested she try some Claritin, and she will see that works for her. Otherwise a detailed review of systems is entirely unremarkable  PAST MEDICAL HISTORY: Past Medical History  Diagnosis Date  . Anemia, chronic renal failure 10/08/2011  . Gout   . CKD (chronic kidney disease)   . H/O colonoscopy 2012  . H/O bone density study   . Fatigue   . Hypertension   . Chronic kidney disease       stage 4 renal disease-on transplant list  . Ovarian ca 10/08/2011  . Complication of anesthesia     hard to wake up  . Status post chemotherapy 2004    Ovarian Cancer  hypotheyroidism  PAST SURGICAL HISTORY: Past Surgical History  Procedure Date  . Appendectomy   . Abdominal hysterectomy Ovarian CA  . Shoulder arthroscopy 2010    rt  . Eye surgery     both cataracts  . Breast biopsy 02/08/12    Right Breast - Invasive Carcinoma, ER 0%, PR 0%, Ki6 75%  . Mastectomy partial / lumpectomy w/ axillary lymphadenectomy 03/18/12    Right Breast- Invasive Ductal: 0/1 Node Negative  s/p TAH-BSO  FAMILY HISTORY Family History  Problem Relation Age of Onset  . Ovarian cancer Sister    the patient's father died at the age of 89 from renal failure likely secondary to hypertension. He did not have diabetes. The patient's mother died at the age of 62 from myocardial infarction. The patient had no brothers, one sister. That sister was diagnosed with ovarian cancer at age 81. She is currently 59 and in remission.  GYNECOLOGIC HISTORY: Menarche age 27, first live birth age 34, she is GX P3, change of life in approximately 89. She used hormone replacement for about 10 years.  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 Her husband [name] is a retired professor of speech in communications. Daughter [name] lives  in [facility]. She is disabled. Daughter [name] teaches at the [employer] school in [place]. Daughter [name] is a school principal in [place], [place]. The patient has 2 grandchildren. She attends the [employer]  ADVANCED DIRECTIVES: in place  HEALTH MAINTENANCE: History  Substance Use Topics  . Smoking status: Former Smoker -- 0.5 packs/day    Quit date: 03/01/1963  . Smokeless tobacco: Not on file  . Alcohol Use: No     Colonoscopy: [name] 2012  PAP: [name] 2012  Bone density:  Lipid panel:  Allergies  Allergen Reactions  . Nsaids     Pt. Does not want this type drug due to impaired renal function  . Renal (Actical)     DOES NOT WANT ANY MEDS THAT EFFECT RENAL FUNTION    Current Outpatient Prescriptions  Medication Sig Dispense Refill  . alendronate (FOSAMAX) 10 MG tablet Take 10 mg by mouth daily before breakfast.       . allopurinol (ZYLOPRIM) 100 MG tablet Take 100 mg by mouth daily.       Marland Kitchen aspirin 81 MG tablet Take 81 mg by mouth daily.      . darbepoetin (ARANESP) 100 MCG/0.5ML SOLN Inject into the skin every 3 (three) months.      Marland Kitchen losartan (COZAAR) 50 MG tablet Take 50 mg by mouth daily.      Marland Kitchen SYNTHROID 25 MCG tablet Take 25 mcg by mouth daily.       Marland Kitchen ZEMPLAR 1 MCG capsule 1 tab every other day        OBJECTIVE: Elderly white woman, who appears mildly fatigued Filed Vitals:   05/22/12 1502  BP: 150/83  Pulse: 109  Temp: 98.2 F (36.8 C)  Resp: 18     Body mass index is 20.44 kg/(m^2).    ECOG FS: 1  Sclerae unicteric Oropharynx clear No cervical or supraclavicular adenopathy in the right axilla Lungs no rales or rhonchi Heart regular rate and rhythm Abd benign MSK no focal spinal tenderness, no peripheral edema Neuro: nonfocal Breasts: Right breast is status post lumpectomy. The incision has healed very well. There is minimal hyperpigmentation but no erythema from the recent radiation. The right axilla is unremarkable. The left breast is benign  LAB RESULTS: Lab Results  Component Value Date   WBC 5.8 05/15/2012   NEUTROABS 4.2 05/15/2012   HGB 10.4* 05/15/2012   HCT 31.8* 05/15/2012   MCV 99.7 05/15/2012   PLT 196 05/15/2012      Chemistry      Component Value Date/Time   NA 137 03/08/2012 1206   K 5.5* 03/08/2012 1206   CL 113* 03/08/2012 1206   CO2 24 03/06/2012 0935   BUN 73* 03/08/2012 1206   CREATININE 3.50* 03/08/2012 1206      Component Value Date/Time   CALCIUM 9.5 03/06/2012 0935   CALCIUM 9.5  04/05/2011 0844   ALKPHOS 83 02/16/2012 1210   AST 23 02/16/2012 1210   ALT 20 02/16/2012 1210   BILITOT 0.3 02/16/2012 1210       Lab Results  Component Value Date   LABCA2 30 02/16/2012    No components found with this basename: [id number]    No results found for this basename: INR:1;PROTIME:1 in the last 168 hours  Urinalysis    Component Value Date/Time   COLORURINE YELLOW 02/20/2009 0915   APPEARANCEUR TURBID* 02/20/2009 0915   LABSPEC 1.011 02/20/2009 0915   PHURINE 6.0  02/20/2009 0915   GLUCOSEU NEGATIVE 02/20/2009 0915   HGBUR SMALL* 02/20/2009 0915   BILIRUBINUR NEGATIVE 02/20/2009 0915   KETONESUR NEGATIVE 02/20/2009 0915   PROTEINUR NEGATIVE 02/20/2009 0915   UROBILINOGEN 0.2 02/20/2009 0915   NITRITE POSITIVE* 02/20/2009 0915   LEUKOCYTESUR LARGE* 02/20/2009 0915    STUDIES: No results found.   ASSESSMENT: 78 year old BRCA 1-2 negative [place] woman  (1) status post TAH/BSO January of 2004 for a stage IC ovarian cancer, followed by 3 cycles of carboplatin/docetaxel completed March of 2004  (2) stage 4/5 chronic kidney disease, possibly doe to hypertension, earlier chemotherapy, or family genetics; on transplant list  (3) anemia of renal failure  (4) s/p right lumpectomy and sentinel lymph node sampling 03/08/2012 for a  T2 N0, stage IIA invasive ductal carcinoma, grade 3, triple negative, with an MIB-1 75%.  (5) completed radiation therapy 05/05/2012  (6) decided against adjuvant chemotherapy   PLAN: Anjalina is doing well and recovering from her radiation treatment and surgery. Her decision not to receive chemotherapy was a difficult 1, but I am not uncomfortable with that decision. The chemotherapy would have reduced her risk of recurrence some, but she would retaining a significant risk of recurrence and of course the odds are that this cancer will not recur (she has all these numbers, which we have discussed previously).  Accordingly we are operationalizing  followup. We are going to be checking a CBC and likely giving her more equal in 3 months, and we will see her at the same time. The plan will be for every 3 month of lab work and at least initially every 3 month visits, with the followup period later extending to every 6 months. She knows to call for any problems that may develop before the next visit   Ecko Beasley C    05/22/2012

## 2012-05-29 ENCOUNTER — Ambulatory Visit: Payer: Medicare Other

## 2012-05-29 NOTE — Progress Notes (Addendum)
Radiation Oncology [phone]  ________________________________  Name: [name] MRN: [id number]   Date: 05/05/2012 DOB: Dec 18, 1930   End of Treatment Note   Diagnosis: pT2, N0, M0 right breast lower inner quadrant invasive ductal carcinoma. High-grade ER/PR negative HER-2/neu negative Ki-67 75%   Indication for treatment: Curative   Radiation treatment dates: 04/10/2012-05/05/2012   Site/dose:  1)Right Breast / 40.05Gy/ 15 fractions  2) Right Breast Boost / 10 Gy/ 5 fractions   Beams/energy:  1) Opposed tangents / 6 MV photons  2) En face Electrons / 15 MeV electrons   Narrative: Ms. Mackowiak tolerated radiation treatment relatively well.  Plan: The patient has completed radiation treatment. The patient will return to radiation oncology clinic for routine followup in one month. I advised them to call or return sooner if they have any questions or concerns related to their recovery or treatment.    -----------------------------------  [name], MD

## 2012-05-29 NOTE — Progress Notes (Signed)
Name: [name] MRN: [id number]  Date: 04/14/2012 DOB: 28-Jan-1931  Status:outpatient  DIAGNOSIS: Breast cancer.  CONSENT VERIFIED: yes  SET UP: Patient is setup supine  IMMOBILIZATION: The following immobilization was used:Custom Moldable Pillow, breast board.  NARRATIVE: [name] underwent complex simulation and treatment planning for her boost treatment today. Her tumor volume was outlined on the planning CT scan. The depth of her cavity was measured.  15 MeV electrons will be prescribed to the 100% Isodose line. Her dose will be 10 Gy in 5 fractions to the lumpectomy cavity.  A custom electron cut-out block will be used for beam modification purposes. A special port plan is requested.   -----------------------------------  [name], MD

## 2012-06-05 ENCOUNTER — Ambulatory Visit: Payer: Medicare Other

## 2012-06-08 ENCOUNTER — Encounter: Payer: Self-pay | Admitting: Radiation Oncology

## 2012-06-09 ENCOUNTER — Encounter: Payer: Self-pay | Admitting: Radiation Oncology

## 2012-06-09 ENCOUNTER — Ambulatory Visit
Admission: RE | Admit: 2012-06-09 | Discharge: 2012-06-09 | Disposition: A | Discharge status: Home / Self Care | Payer: Medicare Other | Source: Ambulatory Visit | Attending: Radiation Oncology | Admitting: Radiation Oncology

## 2012-06-09 VITALS — BP 141/70 | HR 108 | Temp 97.7°F | Wt 135.9 lb

## 2012-06-09 DIAGNOSIS — C50319 Malignant neoplasm of lower-inner quadrant of unspecified female breast: Secondary | ICD-10-CM

## 2012-06-09 HISTORY — DX: Personal history of irradiation: Z92.3

## 2012-06-09 NOTE — Progress Notes (Signed)
Patient here for routine follow up completion of right breast radiation.Patient has occasional shooting pain otherwise tolerated treatment well.States skin changes very minimal.

## 2012-06-09 NOTE — Progress Notes (Signed)
Radiation Oncology         [phone] ________________________________  Name: [name] MRN: [id number]  Date: 06/09/2012  DOB: 01/25/1931  Follow-Up Visit Note  CC: Minda Meo, MD  Hoxworth, Lorne Skeens, MD  Diagnosis:  Pathologic T2 N0 clinical M0 right breast cancer, triple negative  Interval Since Last Radiation: One month  Narrative:  The patient returns today for routine follow-up.  She reports that she is tired. She has lost about 20 pounds since her diagnosis.    She is reviewed some of this to the fact that her daughters are not spending time with her anymore and cooking for the family.     Her baseline weight is 155 pounds and today she is 135. She has been taking Claritin for cough which has been helpful. She denies new shortness of breath, nausea, diarrhea, or pain in her body other than in her right breast. She reports shooting pain pains in her right breast. She has not been applying lotion regularly to her breast. She'll not be pursuing chemotherapy.  As for her weight loss, she is getting in touch with the personalchef to prepare meals      for her and her husband. She is not interested in seeing a nutritionist at this time.  She has noticed a scaly lesion of her left handed and is wondering if she should see a dermatologist. She spends quite a bit outside working in her garden.  ALLERGIES:  is allergic to nsaids and renal.  Meds: Current Outpatient Prescriptions  Medication Sig Dispense Refill  . alendronate (FOSAMAX) 10 MG tablet Take 10 mg by mouth daily before breakfast.       . allopurinol (ZYLOPRIM) 100 MG tablet Take 100 mg by mouth daily.       Marland Kitchen aspirin 81 MG tablet Take 81 mg by mouth daily.      . darbepoetin (ARANESP) 100 MCG/0.5ML SOLN Inject into the skin every 3 (three) months.      Marland Kitchen losartan (COZAAR) 50 MG tablet Take 50 mg by mouth daily.      Marland Kitchen SYNTHROID 25 MCG tablet Take 25 mcg by mouth daily.       Marland Kitchen ZEMPLAR 1 MCG capsule 1 tab every  other day        Physical Findings: The patient is in no acute distress. Patient is alert and oriented.  weight is 135 lb 14.4 oz (61.644 kg). Her temperature is 97.7 F (36.5 C). Her blood pressure is 141/70 and her pulse is 108. .   Right breast is slightly dry. Minimal residual hyperpigmentation. Skin is intact.  Left hand shows a scaly lesion, about half a centimeter in size.  Lab Findings: Lab Results  Component Value Date   WBC 5.8 05/15/2012   HGB 10.4* 05/15/2012   HCT 31.8* 05/15/2012   MCV 99.7 05/15/2012   PLT 196 05/15/2012    CMP     Component Value Date/Time   NA 137 03/08/2012 1206   K 5.5* 03/08/2012 1206   CL 113* 03/08/2012 1206   CO2 24 03/06/2012 0935   GLUCOSE 95 03/08/2012 1206   BUN 73* 03/08/2012 1206   CREATININE 3.50* 03/08/2012 1206   CALCIUM 9.5 03/06/2012 0935   CALCIUM 9.5 04/05/2011 0844   PROT 7.2 02/16/2012 1210   ALBUMIN 3.7 02/16/2012 1210   AST 23 02/16/2012 1210   ALT 20 02/16/2012 1210   ALKPHOS 83 02/16/2012 1210   BILITOT 0.3 02/16/2012 1210   GFRNONAA  13* 03/06/2012 0935   GFRAA 15* 03/06/2012 0935      Radiographic Findings: No results found.  Impression:  The patient is recovering from the effects of radiation.   Plan:   I told the patient that if she continues to lose weight over the next month, she should contact medical oncology to see if any scans are warranted.  I offered for her to meet with our nutritionist but she is not interested in that today. She is interested however in seeing a dermatologist I will make that referral for small lesion over her hand.  Otherwise I will see her back on an as-needed basis. She will continue to be followed by medical oncology. _____________________________________   [name], MD

## 2012-06-11 ENCOUNTER — Other Ambulatory Visit: Payer: Self-pay | Admitting: Oncology

## 2012-06-13 ENCOUNTER — Telehealth: Payer: Self-pay | Admitting: *Deleted

## 2012-06-13 NOTE — Telephone Encounter (Signed)
Add on dr.amy berry on 08-07-2012 starting at 12:45pm   Placed in the edit notes with injection

## 2012-06-26 ENCOUNTER — Ambulatory Visit: Payer: Medicare Other

## 2012-06-26 ENCOUNTER — Other Ambulatory Visit: Payer: Medicare Other | Admitting: Lab

## 2012-08-07 ENCOUNTER — Telehealth: Payer: Self-pay | Admitting: Oncology

## 2012-08-07 ENCOUNTER — Other Ambulatory Visit: Payer: Medicare Other | Admitting: Lab

## 2012-08-07 ENCOUNTER — Encounter: Payer: Self-pay | Admitting: Physician Assistant

## 2012-08-07 ENCOUNTER — Ambulatory Visit: Payer: Medicare Other

## 2012-08-07 ENCOUNTER — Other Ambulatory Visit (HOSPITAL_BASED_OUTPATIENT_CLINIC_OR_DEPARTMENT_OTHER): Payer: Medicare Other | Admitting: Lab

## 2012-08-07 ENCOUNTER — Ambulatory Visit (HOSPITAL_BASED_OUTPATIENT_CLINIC_OR_DEPARTMENT_OTHER): Payer: Medicare Other | Admitting: Physician Assistant

## 2012-08-07 VITALS — BP 128/73 | HR 102 | Temp 98.0°F | Resp 20 | Ht 69.0 in | Wt 137.0 lb

## 2012-08-07 DIAGNOSIS — C50319 Malignant neoplasm of lower-inner quadrant of unspecified female breast: Secondary | ICD-10-CM

## 2012-08-07 DIAGNOSIS — Z8543 Personal history of malignant neoplasm of ovary: Secondary | ICD-10-CM

## 2012-08-07 DIAGNOSIS — D631 Anemia in chronic kidney disease: Secondary | ICD-10-CM

## 2012-08-07 DIAGNOSIS — E319 Polyglandular dysfunction, unspecified: Secondary | ICD-10-CM

## 2012-08-07 DIAGNOSIS — N189 Chronic kidney disease, unspecified: Secondary | ICD-10-CM

## 2012-08-07 LAB — CBC WITH DIFFERENTIAL/PLATELET
Eosinophils Absolute: 0.3 10*3/uL (ref 0.0–0.5)
HCT: 33.1 % — ABNORMAL LOW (ref 34.8–46.6)
LYMPH%: 15.2 % (ref 14.0–49.7)
MCHC: 34.1 g/dL (ref 31.5–36.0)
MCV: 97.8 fL (ref 79.5–101.0)
MONO%: 5.6 % (ref 0.0–14.0)
NEUT#: 5.6 10*3/uL (ref 1.5–6.5)
NEUT%: 74.4 % (ref 38.4–76.8)
Platelets: 188 10*3/uL (ref 145–400)
RBC: 3.38 10*6/uL — ABNORMAL LOW (ref 3.70–5.45)

## 2012-08-07 NOTE — Telephone Encounter (Signed)
gve the pt her nov,dec and feb 2014 appt calendar

## 2012-08-07 NOTE — Patient Instructions (Signed)
Everything looks good!  Return in 2 weeks for repeat CBC and possible Aranesp injection.  Return in 3 months for labs and follow up visit.  Call with any problems or questions   [phone]

## 2012-08-07 NOTE — Progress Notes (Signed)
.[name]   DOB: Mar 01, 1931  MR#: [id number]  CSN#:[id number]  HISTORY OF PRESENT ILLNESS: The patient had routine annual mammography at [facility] 02/02/2012. There was a developing density in the inferior aspect of the right breast and on may 02/07/2012 the patient was brought back for additional views. This confirmed a new multilobulated density in the lower inner quadrant measuring 3.5 cm. By ultrasound this was multilobulated and hypoechoic. It measured at least 2 cm. Biopsy of this mass was obtained the the next day, and showed ([id number]) and invasive ductal carcinoma, grade 2 or 3, which was triple negative, with an MIB-1 of 75%.  Breast specific gamma imaging was obtained Feb 14 2012 and showed only a 2 cm focus of activity in the region of prior biopsy. The patient was evaluated at the multidisciplinary breast cancer clinic on 02/16/2012. Her subsequent history is as detailed below.  INTERVAL HISTORY: [name] returns today for followup of her right breast cancer. She completed her radiation therapy in August, and is "recovering slowly". Her biggest complaint is fatigue with decreased stamina She is trying to exercise daily, but is able to walk only short distances. We discussed increasing her walking to twice daily, and perhaps this would be more readily tolerated been increasing her distance at first.  [name] is eating a little better these days, and has gained 2 pounds since she saw Dr. Basilio Cairo in September. She denies any problems with nausea or emesis, and has had no change in bowel habits.   REVIEW OF SYSTEMS: [name] denies any recent illnesses and has had no fevers or chills. She denies any rashes or signs of abnormal bleeding. She has a couple of "scaly lesions" and is scheduled to see a dermatologist in the next week or 2. She received her most recent Aranesp injection on August 12. She has shortness of breath with exertion, but denies any increased cough. Her throat is still a little sore,  and she has a runny nose. She took Claritin for a few days with minimal relief. She's had no chest pain or palpitations. She denies any abnormal headaches or dizziness. She's had no unusual myalgias, arthralgias, or bony pain.  A detailed review of systems is otherwise stable and noncontributory.   PAST MEDICAL HISTORY: Past Medical History  Diagnosis Date  . Anemia, chronic renal failure 10/08/2011  . Gout   . CKD (chronic kidney disease)   . H/O colonoscopy 2012  . H/O bone density study   . Fatigue   . Hypertension   . Chronic kidney disease     stage 4 renal disease-on transplant list  . Ovarian ca 10/08/2011  . Complication of anesthesia     hard to wake up  . Status post chemotherapy 2004    Ovarian Cancer  . S/P radiation therapy 04/10/12 - 05/05/12    Right Breast/40.05Gy/15 Fractions with Boost of 10gy/5 Fractions  hypotheyroidism  PAST SURGICAL HISTORY: Past Surgical History  Procedure Date  . Appendectomy   . Abdominal hysterectomy Ovarian CA  . Shoulder arthroscopy 2010    rt  . Eye surgery     both cataracts  . Breast biopsy 02/08/12    Right Breast - Invasive Carcinoma, ER 0%, PR 0%, Ki6 75%  . Mastectomy partial / lumpectomy w/ axillary lymphadenectomy 03/18/12    Right Breast- Invasive Ductal: 0/1 Node Negative  s/p TAH-BSO  FAMILY HISTORY Family History  Problem Relation Age of Onset  . Ovarian cancer Sister    the patient's  father died at the age of 32 from renal failure likely secondary to hypertension. He did not have diabetes. The patient's mother died at the age of 39 from myocardial infarction. The patient had no brothers, one sister. That sister was diagnosed with ovarian cancer at age 85. She is currently 76 and in remission.  GYNECOLOGIC HISTORY: Menarche age 33, first live birth age 45, she is GX P3, change of life in approximately 70. She used hormone replacement for about 10 years.  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 Her husband [name] is a retired professor of speech in communications. Daughter [name] lives in [place]. She is disabled. Daughter [name] teaches at the [employer] school in [place]. Daughter [name] is a school principal in [place], [place]. The patient has 2 grandchildren. She attends the [employer]  ADVANCED DIRECTIVES: in place  HEALTH MAINTENANCE: History  Substance Use Topics  . Smoking status: Former Smoker -- 0.5 packs/day    Quit date: 03/01/1963  . Smokeless tobacco: Not on file  . Alcohol Use: No     Colonoscopy: [name] 2012  PAP: [name] 2012  Bone density:  Lipid panel:  Allergies  Allergen Reactions  . Nsaids     Pt. Does not want this type drug due to impaired renal function  . Renal (Actical)     DOES NOT WANT ANY MEDS THAT EFFECT RENAL FUNTION    Current Outpatient Prescriptions  Medication Sig Dispense Refill  . alendronate (FOSAMAX) 10 MG tablet Take 10 mg by mouth daily before breakfast.       . allopurinol (ZYLOPRIM) 100 MG tablet Take 100 mg by mouth daily.       Marland Kitchen aspirin 81 MG tablet Take 81 mg by mouth daily.      . darbepoetin (ARANESP) 100 MCG/0.5ML SOLN Inject into the skin every 3 (three) months.      Marland Kitchen losartan (COZAAR) 50 MG tablet Take 50 mg by mouth daily.      Marland Kitchen SYNTHROID 25 MCG tablet Take 25 mcg by mouth daily.       Marland Kitchen ZEMPLAR 1 MCG capsule 1 tab every other day        OBJECTIVE: Elderly white woman, who appears mildly fatigued but comfortable and in no acute distress  Filed Vitals:   08/07/12 1328  BP: 128/73  Pulse: 102  Temp: 98 F (36.7 C)  Resp: 20     Body mass index is 20.23 kg/(m^2).    ECOG FS: 1 Filed Weights   08/07/12 1328  Weight: 137 lb (62.143 kg)   Sclerae unicteric Oropharynx clear No cervical or supraclavicular adenopathy in the right axilla Lungs no rales or rhonchi Heart regular rate and  rhythm Abd  soft, nontender, with positive bowel sounds MSK no focal spinal tenderness,  No peripheral edema Neuro: nonfocal, alert and oriented x3 Breasts: Right breast is status post lumpectomy. No suspicious nodularity or evidence of local recurrence. The right axilla is unremarkable. The left breast is unremarkable.  LAB RESULTS: Lab Results  Component Value Date   WBC 7.5 08/07/2012   NEUTROABS 5.6 08/07/2012   HGB 11.3* 08/07/2012   HCT 33.1* 08/07/2012   MCV 97.8 08/07/2012   PLT 188 08/07/2012      Chemistry      Component Value Date/Time   NA 137 03/08/2012 1206   K 5.5*  03/08/2012 1206   CL 113* 03/08/2012 1206   CO2 24 03/06/2012 0935   BUN 73* 03/08/2012 1206   CREATININE 3.50* 03/08/2012 1206      Component Value Date/Time   CALCIUM 9.5 03/06/2012 0935   CALCIUM 9.5 04/05/2011 0844   ALKPHOS 83 02/16/2012 1210   AST 23 02/16/2012 1210   ALT 20 02/16/2012 1210   BILITOT 0.3 02/16/2012 1210       Lab Results  Component Value Date   LABCA2 30 02/16/2012    STUDIES: No results found.  She is due for her next annual mammogram in May 2014.    ASSESSMENT: 78 year old BRCA 1-2 negative [place] woman  (1) status post TAH/BSO January of 2004 for a stage IC ovarian cancer, followed by 3 cycles of carboplatin/docetaxel completed March of 2004  (2) stage 4/5 chronic kidney disease, possibly doe to hypertension, earlier chemotherapy, or family genetics; on transplant list  (3) anemia of renal failure, receiving Aranesp as needed  (4) s/p right lumpectomy and sentinel lymph node sampling 03/08/2012 for a  T2 N0, stage IIA invasive ductal carcinoma, grade 3, triple negative, with an MIB-1 75%.  (5) completed radiation therapy 05/05/2012  (6) decided against adjuvant chemotherapy   PLAN: Siriah is doing well and we will continue to follow her closely, at least initially every 3 months. She'll return in 2 weeks for repeat CBC to evaluate her anemia of renal failure, and if necessary  will receive her next Aranesp injection.  Otherwise she'll return for repeat labs and followup visit in early February. We will continue to follow her weight closely as well.  Arieona voices understanding and agreement with this plan, but knows to call with any changes or problems.   Shanessa Hodak    08/07/2012

## 2012-08-21 ENCOUNTER — Encounter: Payer: Self-pay | Admitting: *Deleted

## 2012-08-21 ENCOUNTER — Ambulatory Visit (HOSPITAL_BASED_OUTPATIENT_CLINIC_OR_DEPARTMENT_OTHER): Payer: Medicare Other

## 2012-08-21 ENCOUNTER — Other Ambulatory Visit (HOSPITAL_BASED_OUTPATIENT_CLINIC_OR_DEPARTMENT_OTHER): Payer: Medicare Other | Admitting: Lab

## 2012-08-21 VITALS — BP 132/76 | HR 89 | Temp 98.9°F

## 2012-08-21 DIAGNOSIS — D631 Anemia in chronic kidney disease: Secondary | ICD-10-CM

## 2012-08-21 DIAGNOSIS — C50319 Malignant neoplasm of lower-inner quadrant of unspecified female breast: Secondary | ICD-10-CM

## 2012-08-21 DIAGNOSIS — N039 Chronic nephritic syndrome with unspecified morphologic changes: Secondary | ICD-10-CM

## 2012-08-21 DIAGNOSIS — N189 Chronic kidney disease, unspecified: Secondary | ICD-10-CM

## 2012-08-21 LAB — CBC WITH DIFFERENTIAL/PLATELET
BASO%: 0.3 % (ref 0.0–2.0)
EOS%: 5.4 % (ref 0.0–7.0)
LYMPH%: 13.2 % — ABNORMAL LOW (ref 14.0–49.7)
MCH: 33.8 pg (ref 25.1–34.0)
MCHC: 34.1 g/dL (ref 31.5–36.0)
MONO#: 0.5 10*3/uL (ref 0.1–0.9)
MONO%: 6.9 % (ref 0.0–14.0)
NEUT%: 74.2 % (ref 38.4–76.8)
Platelets: 188 10*3/uL (ref 145–400)
RBC: 3.24 10*6/uL — ABNORMAL LOW (ref 3.70–5.45)
WBC: 6.8 10*3/uL (ref 3.9–10.3)

## 2012-08-21 MED ORDER — DARBEPOETIN ALFA-POLYSORBATE 500 MCG/ML IJ SOLN
300.0000 ug | Freq: Once | INTRAMUSCULAR | Status: AC
  Administered 2012-08-21: 300 ug via SUBCUTANEOUS
  Filled 2012-08-21: qty 1

## 2012-08-21 NOTE — Progress Notes (Signed)
Saw patient today in center lobby.  She was unaccompanied.  She reports she is doing okay. She is progressing slowly.  Relates she was here 9 years ago with ovarian cancer and timeliness to recovery is slower.  Says, "I guess that is a part of aging".  Patient with positive comments about her overall care/experience at [facility] and has expressed others feel the same way.  She has made plans to spend to family and friends for the holidays.  She does not express any needs/concerns with RN but understands to call with questions if they should occur. She is aware of future appointments.

## 2012-09-14 ENCOUNTER — Telehealth: Payer: Self-pay | Admitting: Oncology

## 2012-09-14 NOTE — Telephone Encounter (Signed)
Pt called in to cancel her appts for lab and inj on 09/18/2012. Pt stated that appt was incorrect. Made aMy berry aware.

## 2012-09-18 ENCOUNTER — Other Ambulatory Visit: Payer: Medicare Other | Admitting: Lab

## 2012-09-18 ENCOUNTER — Ambulatory Visit: Payer: Medicare Other

## 2012-10-20 ENCOUNTER — Encounter (INDEPENDENT_AMBULATORY_CARE_PROVIDER_SITE_OTHER): Payer: Self-pay | Admitting: General Surgery

## 2012-10-20 ENCOUNTER — Ambulatory Visit (INDEPENDENT_AMBULATORY_CARE_PROVIDER_SITE_OTHER): Payer: Medicare PPO | Admitting: General Surgery

## 2012-10-20 VITALS — BP 102/72 | HR 85 | Temp 97.2°F | Resp 16 | Ht 69.0 in | Wt 140.0 lb

## 2012-10-20 DIAGNOSIS — C50319 Malignant neoplasm of lower-inner quadrant of unspecified female breast: Secondary | ICD-10-CM

## 2012-10-20 NOTE — Progress Notes (Signed)
History: Patient returns 6 months following right breast lumpectomy followed by radiation for T2 N0 triple negative right breast cancer. Chemotherapy was declined due to her significant renal insufficiency. She reports no problems related to her breasts. Specifically no lump, pain, nipple discharge or skin changes.  Exam: BP 102/72  Pulse 85  Temp 97.2 F (36.2 C) (Temporal)  Resp 16  Ht 5\' 9"  (1.753 m)  Wt 140 lb (63.504 kg)  BMI 20.67 kg/m2 General: Appears well HEENT: No palpable masses or thyromegaly. Lungs: Clear equal breath sounds bilaterally Lymph nodes: No cervical, subclavicular or axillary nodes palpable Breasts: Well-healed lumpectomy site inferior right breast. No thickening or mass here. No other masses or abnormalities palpable in either breast.  Imaging: Due in April of this year  Assessment and plan: Doing well with no complications for treatment of early recurrence. Return in 6 months.

## 2012-11-07 ENCOUNTER — Other Ambulatory Visit: Payer: Medicare Other | Admitting: Lab

## 2012-11-07 ENCOUNTER — Ambulatory Visit: Payer: Medicare Other | Admitting: Physician Assistant

## 2012-11-07 NOTE — Progress Notes (Signed)
[facility] today.  Letter mailed to patient.  

## 2012-11-14 ENCOUNTER — Encounter: Payer: Self-pay | Admitting: Physician Assistant

## 2012-11-14 ENCOUNTER — Telehealth: Payer: Self-pay | Admitting: Oncology

## 2012-11-14 ENCOUNTER — Ambulatory Visit (HOSPITAL_BASED_OUTPATIENT_CLINIC_OR_DEPARTMENT_OTHER): Payer: Medicare PPO | Admitting: Physician Assistant

## 2012-11-14 ENCOUNTER — Other Ambulatory Visit (HOSPITAL_BASED_OUTPATIENT_CLINIC_OR_DEPARTMENT_OTHER): Payer: Medicare PPO | Admitting: Lab

## 2012-11-14 VITALS — BP 151/78 | HR 101 | Temp 98.2°F | Resp 20 | Ht 69.0 in | Wt 137.7 lb

## 2012-11-14 DIAGNOSIS — C50319 Malignant neoplasm of lower-inner quadrant of unspecified female breast: Secondary | ICD-10-CM

## 2012-11-14 DIAGNOSIS — N185 Chronic kidney disease, stage 5: Secondary | ICD-10-CM

## 2012-11-14 DIAGNOSIS — Z853 Personal history of malignant neoplasm of breast: Secondary | ICD-10-CM

## 2012-11-14 DIAGNOSIS — D649 Anemia, unspecified: Secondary | ICD-10-CM

## 2012-11-14 DIAGNOSIS — D631 Anemia in chronic kidney disease: Secondary | ICD-10-CM

## 2012-11-14 DIAGNOSIS — Z8543 Personal history of malignant neoplasm of ovary: Secondary | ICD-10-CM

## 2012-11-14 DIAGNOSIS — N189 Chronic kidney disease, unspecified: Secondary | ICD-10-CM

## 2012-11-14 LAB — COMPREHENSIVE METABOLIC PANEL (CC13)
ALT: 22 U/L (ref 0–55)
CO2: 24 mEq/L (ref 22–29)
Chloride: 105 mEq/L (ref 98–107)
Sodium: 137 mEq/L (ref 136–145)
Total Bilirubin: 0.38 mg/dL (ref 0.20–1.20)
Total Protein: 6.9 g/dL (ref 6.4–8.3)

## 2012-11-14 LAB — CBC WITH DIFFERENTIAL/PLATELET
Eosinophils Absolute: 0.3 10*3/uL (ref 0.0–0.5)
MONO#: 0.4 10*3/uL (ref 0.1–0.9)
NEUT#: 3.9 10*3/uL (ref 1.5–6.5)
RBC: 3.19 10*6/uL — ABNORMAL LOW (ref 3.70–5.45)
RDW: 15.6 % — ABNORMAL HIGH (ref 11.2–14.5)
WBC: 5.3 10*3/uL (ref 3.9–10.3)

## 2012-11-14 LAB — CANCER ANTIGEN 27.29: CA 27.29: 26 U/mL (ref 0–39)

## 2012-11-14 MED ORDER — DARBEPOETIN ALFA-POLYSORBATE 300 MCG/0.6ML IJ SOLN
300.0000 ug | Freq: Once | INTRAMUSCULAR | Status: AC
  Administered 2012-11-14: 300 ug via SUBCUTANEOUS
  Filled 2012-11-14: qty 0.6

## 2012-11-14 NOTE — Progress Notes (Signed)
.[name]   DOB: [birth year]  MR#: [id number]  WUJ#:[id number]  HISTORY OF PRESENT ILLNESS: The patient had routine annual mammography at [facility] 02/02/2012. There was a developing density in the inferior aspect of the right breast and on may 02/07/2012 the patient was brought back for additional views. This confirmed a new multilobulated density in the lower inner quadrant measuring 3.5 cm. By ultrasound this was multilobulated and hypoechoic. It measured at least 2 cm. Biopsy of this mass was obtained the the next day, and showed ([id number]) and invasive ductal carcinoma, grade 2 or 3, which was triple negative, with an MIB-1 of 75%.  Breast specific gamma imaging was obtained Feb 14 2012 and showed only a 2 cm focus of activity in the region of prior biopsy. The patient was evaluated at the multidisciplinary breast cancer clinic on 02/16/2012. Her subsequent history is as detailed below.  INTERVAL HISTORY: [name] returns today for followup of her right breast cancer in her anemia of renal failure. She tells me she is doing well. She continues to be followed by Dr. Caryn Section for her renal failure, and is due for her next q. 3 month dose of Aranesp today. Her energy level is fair. She has some shortness of breath with exertion which has not changed. Overall she is "feeling fine".  REVIEW OF SYSTEMS: [name] denies any recent illnesses and has had no fevers or chills. She denies any rashes or signs of abnormal bleeding.  Her throat is still a little sore, but she has been treated for oral candidiasis and this is resolving. She denies any cough or phlegm production. She's had no chest pain or palpitations. She denies any abnormal headaches or dizziness. She's had no unusual myalgias, arthralgias, or bony pain, and no peripheral swelling.  A detailed review of systems is otherwise stable and noncontributory.   PAST MEDICAL HISTORY: Past Medical History  Diagnosis Date  . Anemia, chronic renal failure  10/08/2011  . Gout   . CKD (chronic kidney disease)   . H/O colonoscopy 2012  . H/O bone density study   . Fatigue   . Hypertension   . Chronic kidney disease     stage 4 renal disease-on transplant list  . Ovarian ca 10/08/2011  . Complication of anesthesia     hard to wake up  . Status post chemotherapy 2004    Ovarian Cancer  . S/P radiation therapy 04/10/12 - 05/05/12    Right Breast/40.05Gy/15 Fractions with Boost of 10gy/5 Fractions  hypotheyroidism  PAST SURGICAL HISTORY: Past Surgical History  Procedure Laterality Date  . Appendectomy    . Abdominal hysterectomy  Ovarian CA  . Shoulder arthroscopy  2010    rt  . Eye surgery      both cataracts  . Breast biopsy  02/08/12    Right Breast - Invasive Carcinoma, ER 0%, PR 0%, Ki6 75%  . Mastectomy partial / lumpectomy w/ axillary lymphadenectomy  03/18/12    Right Breast- Invasive Ductal: 0/1 Node Negative  s/p TAH-BSO  FAMILY HISTORY Family History  Problem Relation Age of Onset  . Ovarian cancer Sister    the patient's father died at the age of 10 from renal failure likely secondary to hypertension. He did not have diabetes. The patient's mother died at the age of 41 from myocardial infarction. The patient had no brothers, one sister. That sister was diagnosed with ovarian cancer at age 36. She is currently 59 and in remission.  GYNECOLOGIC HISTORY: Menarche age  16, first live birth age 26, she is GX P3, change of life in approximately 22. She used hormone replacement for about 10 years.  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 Her husband [name] is a retired professor of speech in communications. Daughter [name] lives in [place]. She is disabled. Daughter [name] teaches at the [employer] school in [place]. Daughter [name] is a school principal in [place], [place]. The patient has 2 grandchildren. She attends the  [employer]  ADVANCED DIRECTIVES: in place  HEALTH MAINTENANCE: History  Substance Use Topics  . Smoking status: Former Smoker -- 0.50 packs/day    Quit date: 03/01/1963  . Smokeless tobacco: Not on file  . Alcohol Use: No     Colonoscopy: [name] 2012  PAP: [name] 2012  Bone density:  Lipid panel:  Allergies  Allergen Reactions  . Nsaids     Pt. Does not want this type drug due to impaired renal function  . Renal (Actical)     DOES NOT WANT ANY MEDS THAT EFFECT RENAL FUNTION    Current Outpatient Prescriptions  Medication Sig Dispense Refill  . alendronate (FOSAMAX) 10 MG tablet Take 10 mg by mouth daily before breakfast.       . allopurinol (ZYLOPRIM) 100 MG tablet Take 100 mg by mouth daily.       Marland Kitchen aspirin 81 MG tablet Take 81 mg by mouth daily.      . darbepoetin (ARANESP) 100 MCG/0.5ML SOLN Inject into the skin every 3 (three) months.      Marland Kitchen losartan (COZAAR) 50 MG tablet Take 50 mg by mouth daily.      Marland Kitchen nystatin (MYCOSTATIN) 100000 UNIT/ML suspension       . SYNTHROID 25 MCG tablet Take 25 mcg by mouth daily.       Marland Kitchen ZEMPLAR 1 MCG capsule 1 tab every other day       No current facility-administered medications for this visit.    OBJECTIVE: [name], who appears mildly fatigued but comfortable and in no acute distress  Filed Vitals:   11/14/12 1328  BP: 151/78  Pulse: 101  Temp: 98.2 F (36.8 C)  Resp: 20     Body mass index is 20.33 kg/(m^2).    ECOG FS: 1 Filed Weights   11/14/12 1328  Weight: 137 lb 11.2 oz (62.46 kg)   Sclerae unicteric Oropharynx clear No cervical or supraclavicular adenopathy Lungs no rales or rhonchi Heart regular rate and rhythm Abdomen  soft, nontender, with positive bowel sounds MSK no focal spinal tenderness,  No peripheral edema Neuro: nonfocal, well oriented with positive affect Breasts: Right breast is status post lumpectomy. No  evidence of local recurrence. The right axilla is  unremarkable. The left breast is unremarkable.  LAB RESULTS: Lab Results  Component Value Date   WBC 5.3 11/14/2012   NEUTROABS 3.9 11/14/2012   HGB 10.6* 11/14/2012   HCT 31.7* 11/14/2012   MCV 99.3 11/14/2012   PLT 189 11/14/2012      Chemistry      Component Value Date/Time   NA 137 11/14/2012 1303   NA 137 03/08/2012 1206   K 5.0 11/14/2012 1303   K 5.5* 03/08/2012 1206   CL 105 11/14/2012 1303   CL 113* 03/08/2012 1206   CO2 24 11/14/2012 1303   CO2 24 03/06/2012 0935   BUN 66.5*  11/14/2012 1303   BUN 73* 03/08/2012 1206   CREATININE 3.3 Repeated and Verified* 11/14/2012 1303   CREATININE 3.50* 03/08/2012 1206      Component Value Date/Time   CALCIUM 10.5* 11/14/2012 1303   CALCIUM 9.5 03/06/2012 0935   CALCIUM 9.5 04/05/2011 0844   ALKPHOS 94 11/14/2012 1303   ALKPHOS 83 02/16/2012 1210   AST 25 11/14/2012 1303   AST 23 02/16/2012 1210   ALT 22 11/14/2012 1303   ALT 20 02/16/2012 1210   BILITOT 0.38 11/14/2012 1303   BILITOT 0.3 02/16/2012 1210       Lab Results  Component Value Date   LABCA2 30 02/16/2012    STUDIES: No results found.  She is due for her next annual mammogram in May 2014.    ASSESSMENT: 79 year old BRCA 1-2 negative [place] woman  (1) status post TAH/BSO January of 2004 for a stage IC ovarian cancer, followed by 3 cycles of carboplatin/docetaxel completed March of 2004  (2) stage 4/5 chronic kidney disease, possibly doe to hypertension, earlier chemotherapy, or family genetics; on transplant list;  followed by Dr. Caryn Section.  (3) anemia of renal failure, receiving Aranesp as needed every 3 months  (4) s/p right lumpectomy and sentinel lymph node sampling 03/08/2012 for a  T2 N0, stage IIA invasive ductal carcinoma, grade 3, triple negative, with an MIB-1 75%.  (5) completed radiation therapy 05/05/2012  (6) decided against adjuvant chemotherapy, followed with observation   PLAN:  Weslyn continues to do well with regards to her breast cancer. She'll continue to be  followed closely, and, per [facility] request, we will alternate our q. 6 month visits with Dr. Johna Sheriff so that she is evaluated every 3 months. Accordingly, she will return to see Dr. Johna Sheriff in late April or early May, and we'll see Dr. Darnelle Catalan again in August.  Alfrieda will receive her Aranesp injection today as scheduled. She'll continue to be followed by Dr. Caryn Section for her renal failure. In fact, she scheduled to see him again next week she says. We will continue with Alyssamarie's Aranesp injections every 3 months, and these will be scheduled for mid May, then again in mid August when she sees Dr. Darnelle Catalan.   Mikeala voices understanding and agreement with this plan, but knows to call with any changes or problems.   Rexton Greulich    11/14/2012

## 2012-11-14 NOTE — Telephone Encounter (Signed)
gv pt appt schedule for May and August. Pt also given mammo for 5/7 @ 10:15am @ [facility]. S/w Toniann Fail @ [facility] and pt already on recall for early May.

## 2012-12-26 ENCOUNTER — Telehealth: Payer: Self-pay | Admitting: *Deleted

## 2012-12-26 NOTE — Telephone Encounter (Signed)
sw pt she is aware of her appt changes and requested to see GCM on 05/21/13 so that she will not have to make two co-pay for her next injection shot.

## 2013-01-16 ENCOUNTER — Ambulatory Visit: Payer: Self-pay | Admitting: Obstetrics & Gynecology

## 2013-01-16 ENCOUNTER — Telehealth: Payer: Self-pay | Admitting: Obstetrics & Gynecology

## 2013-01-16 ENCOUNTER — Encounter: Payer: Self-pay | Admitting: *Deleted

## 2013-01-16 NOTE — Telephone Encounter (Signed)
Pt cancelled appt for today because office does not participate with [employer].

## 2013-02-04 ENCOUNTER — Other Ambulatory Visit: Payer: Self-pay | Admitting: Obstetrics & Gynecology

## 2013-02-06 ENCOUNTER — Encounter: Payer: Self-pay | Admitting: Obstetrics & Gynecology

## 2013-02-06 ENCOUNTER — Ambulatory Visit: Payer: Self-pay | Admitting: Obstetrics & Gynecology

## 2013-02-06 ENCOUNTER — Ambulatory Visit (INDEPENDENT_AMBULATORY_CARE_PROVIDER_SITE_OTHER): Payer: Medicare PPO | Admitting: Obstetrics & Gynecology

## 2013-02-06 VITALS — BP 138/70 | Ht 67.0 in | Wt 137.4 lb

## 2013-02-06 DIAGNOSIS — Z01419 Encounter for gynecological examination (general) (routine) without abnormal findings: Secondary | ICD-10-CM

## 2013-02-06 DIAGNOSIS — Z8543 Personal history of malignant neoplasm of ovary: Secondary | ICD-10-CM

## 2013-02-06 NOTE — Patient Instructions (Signed)

## 2013-02-06 NOTE — Progress Notes (Signed)
79 y.o. G3P3 Married caucasian F here for annual exam.  No vaginal bleeding.  She has completed her radiation after her lumpectomy.  Major issue is fatigue since treatment.  Reports it is better but still not to the point before her breast cancer diagnosis.  Last cholesterol was in 2012.  Will see Dr. Jacky Kindle this year.  She has lost some weight since her breast cancer treatment.  Lost 20 lbs but has gained about 4 back.   No LMP recorded. Patient has had a hysterectomy.          Sexually active: no  The current method of family planning is status post hysterectomy and post menopausal status.    Exercising: yes  silver sneakers Smoker:  no  Health Maintenance: Pap:  01/18/11 WNL MMG:  2013-breast cancer, mmg scheduled 5/14 Colonoscopy:  2012 BMD:  2007 TDaP:  unsure Labs: PCP   reports that she quit smoking about 49 years ago. She has never used smokeless tobacco. She reports that she does not drink alcohol or use illicit drugs.  Past Medical History  Diagnosis Date  . Anemia, chronic renal failure 10/08/2011  . Gout   . CKD (chronic kidney disease)   . H/O colonoscopy 2012  . H/O bone density study   . Fatigue   . Hypertension   . Chronic kidney disease     stage 4 renal disease-on transplant list  . Complication of anesthesia     hard to wake up  . Status post chemotherapy 2004    Ovarian Cancer  . S/P radiation therapy 04/10/12 - 05/05/12    Right Breast/40.05Gy/15 Fractions with Boost of 10gy/5 Fractions  . Atrophic vaginitis 3/71991  . Fibroadenoma 02/17/1989    right-stable   . Ovarian ca 10/08/2011  . Ovarian cancer 10/2002  . Breast cancer 2013    invasive breast cancer    Past Surgical History  Procedure Laterality Date  . Appendectomy    . Abdominal hysterectomy  Ovarian CA  . Shoulder arthroscopy  2010    rt  . Eye surgery      both cataracts  . Breast biopsy  02/08/12    Right Breast - Invasive Carcinoma, ER 0%, PR 0%, Ki6 75%  . Mastectomy partial / lumpectomy w/  axillary lymphadenectomy  03/18/12    Right Breast- Invasive Ductal: 0/1 Node Negative    Current Outpatient Prescriptions  Medication Sig Dispense Refill  . alendronate (FOSAMAX) 10 MG tablet TAKE 1 TABLET DAILY AS DIRECTED.  30 tablet  3  . allopurinol (ZYLOPRIM) 100 MG tablet Take 100 mg by mouth daily.       Marland Kitchen aspirin 81 MG tablet Take 81 mg by mouth daily.      . darbepoetin (ARANESP) 100 MCG/0.5ML SOLN Inject into the skin every 3 (three) months.      . Iron-Vitamin C (IRON 100/C PO) Take by mouth daily.      Marland Kitchen losartan (COZAAR) 50 MG tablet Take 50 mg by mouth daily.      . Multiple Vitamins-Minerals (MULTIVITAMIN PO) Take by mouth daily.      Marland Kitchen SYNTHROID 25 MCG tablet Take 25 mcg by mouth daily.       Marland Kitchen ZEMPLAR 1 MCG capsule 1 tab every other day       No current facility-administered medications for this visit.    Family History  Problem Relation Age of Onset  . Ovarian cancer Sister     ROS:  Pertinent items are noted  in HPI.  Otherwise, a comprehensive ROS was negative.  Exam:   BP 138/70  Ht 5\' 7"  (1.702 m)  Wt 137 lb 6.4 oz (62.324 kg)  BMI 21.51 kg/m2  Weight change: -16lb  Height: 5\' 7"  (170.2 cm)  Ht Readings from Last 3 Encounters:  02/06/13 5\' 7"  (1.702 m)  11/14/12 5\' 9"  (1.753 m)  10/20/12 5\' 9"  (1.753 m)    General appearance: alert, cooperative and appears stated age Head: Normocephalic, without obvious abnormality, atraumatic Neck: no adenopathy, supple, symmetrical, trachea midline and thyroid normal to inspection and palpation Lungs: clear to auscultation bilaterally Breasts: normal appearance, no masses or tenderness, on left; on right there is a lumpectomy scar with appropriate radiaiton changes. Heart: regular rate and rhythm Abdomen: soft, non-tender; bowel sounds normal; no masses,  no organomegaly Extremities: extremities normal, atraumatic, no cyanosis or edema Skin: Skin color, texture, turgor normal. No rashes or lesions Lymph nodes:  Cervical, supraclavicular, and axillary nodes normal. No abnormal inguinal nodes palpated Neurologic: Grossly normal   Pelvic: External genitalia:  no lesions              Urethra:  normal appearing urethra with no masses, tenderness or lesions              Bartholins and Skenes: normal                 Vagina: normal appearing vagina with normal color and discharge, no lesions, atrophy with 2nd degree cystocele present              Cervix: absent              Pap taken: no Bimanual Exam:  Uterus:  uterus absent              Adnexa: no mass, fullness, tenderness               Rectovaginal: Confirms               Anus:  normal sphincter tone, no lesions  A:  Well Woman with normal exam PMP, no HRT H/O IC ovarian cancer 1/04 Breast cancer (triple -) 8/13 BRCA I/II negative Chronic renal failure (creatinine 2.9) hypertension  P:   Mammogram yearly.  Pt has scheduled. Ca-125 today.  After pt was released from Dr. Stanford Breed he advised yearly with physical exam and MMG. pap smear not needed. Patient will see Dr. Jacky Kindle later this year for exam and screening labs. Fosamax 10mg  qday.  Will let me know if needs RF.  Will do BMD this year.  return annually or prn  An After Visit Summary was printed and given to the patient.

## 2013-02-15 ENCOUNTER — Other Ambulatory Visit (HOSPITAL_BASED_OUTPATIENT_CLINIC_OR_DEPARTMENT_OTHER): Payer: Medicare PPO | Admitting: Lab

## 2013-02-15 ENCOUNTER — Ambulatory Visit (HOSPITAL_BASED_OUTPATIENT_CLINIC_OR_DEPARTMENT_OTHER): Payer: Medicare PPO

## 2013-02-15 VITALS — BP 162/80 | HR 92 | Temp 98.3°F

## 2013-02-15 DIAGNOSIS — D631 Anemia in chronic kidney disease: Secondary | ICD-10-CM

## 2013-02-15 DIAGNOSIS — C50319 Malignant neoplasm of lower-inner quadrant of unspecified female breast: Secondary | ICD-10-CM

## 2013-02-15 DIAGNOSIS — N189 Chronic kidney disease, unspecified: Secondary | ICD-10-CM

## 2013-02-15 DIAGNOSIS — N039 Chronic nephritic syndrome with unspecified morphologic changes: Secondary | ICD-10-CM

## 2013-02-15 LAB — COMPREHENSIVE METABOLIC PANEL (CC13)
AST: 26 U/L (ref 5–34)
Alkaline Phosphatase: 107 U/L (ref 40–150)
BUN: 61.6 mg/dL — ABNORMAL HIGH (ref 7.0–26.0)
Creatinine: 3.1 mg/dL (ref 0.6–1.1)
Total Bilirubin: 0.26 mg/dL (ref 0.20–1.20)

## 2013-02-15 LAB — CBC WITH DIFFERENTIAL/PLATELET
Eosinophils Absolute: 0.3 10*3/uL (ref 0.0–0.5)
LYMPH%: 10.5 % — ABNORMAL LOW (ref 14.0–49.7)
MONO#: 0.4 10*3/uL (ref 0.1–0.9)
NEUT#: 5 10*3/uL (ref 1.5–6.5)
Platelets: 169 10*3/uL (ref 145–400)
RBC: 2.83 10*6/uL — ABNORMAL LOW (ref 3.70–5.45)
WBC: 6.4 10*3/uL (ref 3.9–10.3)

## 2013-02-15 MED ORDER — DARBEPOETIN ALFA-POLYSORBATE 300 MCG/0.6ML IJ SOLN
300.0000 ug | Freq: Once | INTRAMUSCULAR | Status: AC
  Administered 2013-02-15: 300 ug via SUBCUTANEOUS
  Filled 2013-02-15: qty 0.6

## 2013-03-20 ENCOUNTER — Encounter: Payer: Self-pay | Admitting: Oncology

## 2013-03-20 NOTE — Progress Notes (Signed)
I left a message for the patient last night.  She called me back.  I called [facility] [phone] and spoke with Nasha. I faxed 24 pages to [phone].

## 2013-05-09 ENCOUNTER — Other Ambulatory Visit: Payer: Self-pay

## 2013-05-15 ENCOUNTER — Other Ambulatory Visit: Payer: Medicare PPO | Admitting: Lab

## 2013-05-15 ENCOUNTER — Ambulatory Visit: Payer: Medicare PPO

## 2013-05-15 ENCOUNTER — Ambulatory Visit: Payer: Medicare PPO | Admitting: Oncology

## 2013-05-21 ENCOUNTER — Other Ambulatory Visit: Payer: Medicare PPO | Admitting: Lab

## 2013-05-21 ENCOUNTER — Ambulatory Visit: Payer: Medicare PPO

## 2013-05-21 ENCOUNTER — Other Ambulatory Visit (HOSPITAL_BASED_OUTPATIENT_CLINIC_OR_DEPARTMENT_OTHER): Payer: Medicare PPO | Admitting: Lab

## 2013-05-21 ENCOUNTER — Ambulatory Visit (HOSPITAL_BASED_OUTPATIENT_CLINIC_OR_DEPARTMENT_OTHER): Payer: Medicare PPO

## 2013-05-21 ENCOUNTER — Ambulatory Visit (HOSPITAL_BASED_OUTPATIENT_CLINIC_OR_DEPARTMENT_OTHER): Payer: Medicare PPO | Admitting: Oncology

## 2013-05-21 ENCOUNTER — Telehealth: Payer: Self-pay | Admitting: Oncology

## 2013-05-21 VITALS — BP 128/69 | HR 88 | Temp 97.9°F | Resp 20 | Ht 67.0 in | Wt 138.3 lb

## 2013-05-21 DIAGNOSIS — N185 Chronic kidney disease, stage 5: Secondary | ICD-10-CM

## 2013-05-21 DIAGNOSIS — M810 Age-related osteoporosis without current pathological fracture: Secondary | ICD-10-CM

## 2013-05-21 DIAGNOSIS — D631 Anemia in chronic kidney disease: Secondary | ICD-10-CM

## 2013-05-21 DIAGNOSIS — C50319 Malignant neoplasm of lower-inner quadrant of unspecified female breast: Secondary | ICD-10-CM

## 2013-05-21 LAB — COMPREHENSIVE METABOLIC PANEL (CC13)
AST: 25 U/L (ref 5–34)
BUN: 65.7 mg/dL — ABNORMAL HIGH (ref 7.0–26.0)
Calcium: 9.3 mg/dL (ref 8.4–10.4)
Chloride: 108 mEq/L (ref 98–109)
Creatinine: 3.2 mg/dL (ref 0.6–1.1)

## 2013-05-21 LAB — CBC WITH DIFFERENTIAL/PLATELET
Basophils Absolute: 0 10*3/uL (ref 0.0–0.1)
EOS%: 4.9 % (ref 0.0–7.0)
HCT: 31.3 % — ABNORMAL LOW (ref 34.8–46.6)
HGB: 10.4 g/dL — ABNORMAL LOW (ref 11.6–15.9)
MCH: 33.7 pg (ref 25.1–34.0)
MCV: 101.2 fL — ABNORMAL HIGH (ref 79.5–101.0)
MONO%: 5.2 % (ref 0.0–14.0)
NEUT%: 77.8 % — ABNORMAL HIGH (ref 38.4–76.8)
Platelets: 200 10*3/uL (ref 145–400)
lymph#: 1 10*3/uL (ref 0.9–3.3)

## 2013-05-21 MED ORDER — DARBEPOETIN ALFA-POLYSORBATE 500 MCG/ML IJ SOLN
300.0000 ug | Freq: Once | INTRAMUSCULAR | Status: AC
  Administered 2013-05-21: 300 ug via SUBCUTANEOUS
  Filled 2013-05-21: qty 1

## 2013-05-21 NOTE — Progress Notes (Signed)
.[name]   DOB: Jan 31, 1931  MR#: [id number]  CSN#:[id number]  PCP: Minda Meo, MD GYN: [name] SU: Glenna Fellows OTHER MD: RichardFox   HISTORY OF PRESENT ILLNESS: The patient had routine annual mammography at [facility] 02/02/2012. There was a developing density in the inferior aspect of the right breast and on may 02/07/2012 the patient was brought back for additional views. This confirmed a new multilobulated density in the lower inner quadrant measuring 3.5 cm. By ultrasound this was multilobulated and hypoechoic. It measured at least 2 cm. Biopsy of this mass was obtained the the next day, and showed ([id number]) and invasive ductal carcinoma, grade 2 or 3, which was triple negative, with an MIB-1 of 75%.  Breast specific gamma imaging was obtained Feb 14 2012 and showed only a 2 cm focus of activity in the region of prior biopsy. The patient was evaluated at the multidisciplinary breast cancer clinic on 02/16/2012. Her subsequent history is as detailed below.  INTERVAL HISTORY: [name] returns today for followup of her right breast cancer in her anemia of renal failure. The interval history is fine. She had a cataract surgery and as she has just about 2020 vision. She's been really busy because her husband has severe back pain which is being evaluated for degenerative disc disease.  REVIEW OF SYSTEMS: [name] has not had any unusual headaches, visual changes, cough, phlegm production, pleurisy, dizziness, gait imbalance, change in bowel or bladder habits, bleeding, rash, fever, or any  pain. She is tolerating her iron as without any side effects. She also gives me no symptoms associated with her anemia. The detailed review of systems was otherwise noncontributory  PAST MEDICAL HISTORY: Past Medical History  Diagnosis Date  . Gout   . Fatigue   . Hypertension   . Chronic kidney disease     stage 4 renal disease-on transplant list  . S/P radiation therapy 04/10/12 - 05/05/12   Right Breast/40.05Gy/15 Fractions with Boost of 10gy/5 Fractions  . Atrophic vaginitis 3/71991  . Fibroadenoma 02/17/1989    right-stable   . Ovarian cancer 10/2002  . Breast cancer 2013    invasive breast cancer  . Anemia   hypotheyroidism  PAST SURGICAL HISTORY: Past Surgical History  Procedure Laterality Date  . Appendectomy    . Total abdominal hysterectomy w/ bilateral salpingoophorectomy  1/04    Stage IC ovarian cancer  . Shoulder arthroscopy  2010    rt  . Eye surgery      both cataracts  . Breast biopsy  02/08/12    Right Breast - Invasive Carcinoma, ER 0%, PR 0%, Ki6 75%  . Mastectomy partial / lumpectomy w/ axillary lymphadenectomy  03/08/12    Right Breast- Invasive Ductal: 0/1 Node Negative  s/p TAH-BSO  FAMILY HISTORY Family History  Problem Relation Age of Onset  . Ovarian cancer Sister    the patient's father died at the age of 31 from renal failure likely secondary to hypertension. He did not have diabetes. The patient's mother died at the age of 35 from myocardial infarction. The patient had no brothers, one sister. That sister was diagnosed with ovarian cancer at age 26. She is currently 50 and in remission.  GYNECOLOGIC HISTORY: Menarche age 39, first live birth age 89, she is GX P3, change of life in approximately 10. She used hormone replacement for about 10 years.  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 Her  husband [name] is a retired professor of speech in communications. Daughter [name] lives in [place]. She is disabled. Daughter [name] teaches at the [employer] school in [place]. Daughter [name] is a school principal in [place], [place]. The patient has 2 grandchildren. She attends the [employer]  ADVANCED DIRECTIVES: in place  HEALTH MAINTENANCE: History  Substance Use Topics  . Smoking status: Former Smoker -- 0.50  packs/day    Quit date: 03/01/1963  . Smokeless tobacco: Never Used  . Alcohol Use: No     Colonoscopy: [name] 2012  PAP: [name] 2012  Bone density:  Lipid panel:  Allergies  Allergen Reactions  . Nsaids     Pt. Does not want this type drug due to impaired renal function  . Renal [Actical]     DOES NOT WANT ANY MEDS THAT EFFECT RENAL FUNTION    Current Outpatient Prescriptions  Medication Sig Dispense Refill  . alendronate (FOSAMAX) 10 MG tablet TAKE 1 TABLET DAILY AS DIRECTED.  30 tablet  3  . allopurinol (ZYLOPRIM) 100 MG tablet Take 100 mg by mouth daily.       Marland Kitchen aspirin 81 MG tablet Take 81 mg by mouth daily.      . darbepoetin (ARANESP) 100 MCG/0.5ML SOLN Inject into the skin every 3 (three) months.      . Iron-Vitamin C (IRON 100/C PO) Take by mouth daily.      Marland Kitchen losartan (COZAAR) 50 MG tablet Take 50 mg by mouth daily.      . Multiple Vitamins-Minerals (MULTIVITAMIN PO) Take by mouth daily.      Marland Kitchen SYNTHROID 25 MCG tablet Take 25 mcg by mouth daily.       Marland Kitchen ZEMPLAR 1 MCG capsule 1 tab every other day       No current facility-administered medications for this visit.    OBJECTIVE: [name] in no acute distress Filed Vitals:   05/21/13 0905  BP: 128/69  Pulse: 88  Temp: 97.9 F (36.6 C)  Resp: 20     Body mass index is 21.66 kg/(m^2).    ECOG FS: 0 Filed Weights   05/21/13 0905  Weight: 138 lb 4.8 oz (62.732 kg)   Sclerae unicteric, pupils equal and reactive to light Oropharynx clear No cervical or supraclavicular adenopathy Lungs no rales or rhonchi Heart regular rate and rhythm, no murmur appreciated Abdomen  soft, nontender, with positive bowel sounds MSK no focal spinal tenderness,  No peripheral edema Neuro: nonfocal, well oriented, positive affect Breasts: Right breast is status post lumpectomy and radiation. There is evidence of local recurrence. The right axilla is unremarkable. The left breast is unremarkable.  LAB RESULTS: Lab  Results  Component Value Date   WBC 8.0 05/21/2013   NEUTROABS 6.3 05/21/2013   HGB 10.4* 05/21/2013   HCT 31.3* 05/21/2013   MCV 101.2* 05/21/2013   PLT 200 05/21/2013      Chemistry      Component Value Date/Time   NA 137 02/15/2013 1118   NA 137 03/08/2012 1206   K 4.8 02/15/2013 1118   K 5.5* 03/08/2012 1206   CL 105 02/15/2013 1118   CL 113* 03/08/2012 1206   CO2 24 02/15/2013 1118   CO2 24 03/06/2012 0935   BUN 61.6* 02/15/2013 1118   BUN 73* 03/08/2012 1206   CREATININE 3.1 Repeated and Verified* 02/15/2013 1118   CREATININE 3.50* 03/08/2012 1206  Component Value Date/Time   CALCIUM 9.4 02/15/2013 1118   CALCIUM 9.5 03/06/2012 0935   CALCIUM 9.5 04/05/2011 0844   ALKPHOS 107 02/15/2013 1118   ALKPHOS 83 02/16/2012 1210   AST 26 02/15/2013 1118   AST 23 02/16/2012 1210   ALT 24 02/15/2013 1118   ALT 20 02/16/2012 1210   BILITOT 0.26 02/15/2013 1118   BILITOT 0.3 02/16/2012 1210       Lab Results  Component Value Date   LABCA2 26 11/14/2012    STUDIES: Mammography may 2014 was unremarkable. Bone density at the same time showed osteoporosis with a T score of -2 point.    ASSESSMENT: 79 y.o. BRCA 1-2 negative [place] woman  (1) status post TAH/BSO January of 2004 for a stage IC ovarian cancer, followed by 3 cycles of carboplatin/docetaxel completed March of 2004  (2) stage 4/5 chronic kidney disease, possibly doe to hypertension, earlier chemotherapy, or family genetics; on transplant list;  followed by Dr. Caryn Section.  (3) anemia of renal failure, receiving Aranesp as needed every 3 months  (4) s/p right lumpectomy and sentinel lymph node sampling 03/08/2012 for a  T2 N0, stage IIA invasive ductal carcinoma, grade 3, triple negative, with an MIB-1 of 75%.  (5) completed radiation therapy 05/05/2012  (6) decided against adjuvant chemotherapy, followed with observation   PLAN:  Nacole is doing well from a breast cancer point of view. If she sees Dr. Johna Sheriff this November, she can see  me late May next year, after her mammogram, and then continue to see each one of [place] on a yearly basis, 6 months apart, May/November. She will continue to receive aranesp here every 3 months. So long as her hemoglobin is less than 11, she will qualify. Otherwise the plan is to follow her for total of 5 years before releasing her back to her primary care physician. She knows to call for any problems that may develop before her next visit here.  MAGRINAT,GUSTAV C    05/21/2013

## 2013-05-21 NOTE — Telephone Encounter (Signed)
, °

## 2013-06-09 ENCOUNTER — Other Ambulatory Visit: Payer: Self-pay | Admitting: Obstetrics & Gynecology

## 2013-06-11 NOTE — Telephone Encounter (Signed)
Please advise patient was seen 02/06/13 For AEX #30/3 rf's was sent 02/04/13 through to pharmacy.  Please advise.

## 2013-08-09 ENCOUNTER — Other Ambulatory Visit: Payer: Self-pay

## 2013-08-13 ENCOUNTER — Ambulatory Visit (HOSPITAL_BASED_OUTPATIENT_CLINIC_OR_DEPARTMENT_OTHER): Payer: Medicare PPO

## 2013-08-13 ENCOUNTER — Other Ambulatory Visit (HOSPITAL_BASED_OUTPATIENT_CLINIC_OR_DEPARTMENT_OTHER): Payer: Medicare PPO | Admitting: Lab

## 2013-08-13 VITALS — BP 158/78 | HR 91 | Temp 97.7°F

## 2013-08-13 DIAGNOSIS — N185 Chronic kidney disease, stage 5: Secondary | ICD-10-CM

## 2013-08-13 DIAGNOSIS — D631 Anemia in chronic kidney disease: Secondary | ICD-10-CM

## 2013-08-13 DIAGNOSIS — C50319 Malignant neoplasm of lower-inner quadrant of unspecified female breast: Secondary | ICD-10-CM

## 2013-08-13 LAB — COMPREHENSIVE METABOLIC PANEL (CC13)
AST: 35 U/L — ABNORMAL HIGH (ref 5–34)
Albumin: 3.2 g/dL — ABNORMAL LOW (ref 3.5–5.0)
Alkaline Phosphatase: 138 U/L (ref 40–150)
BUN: 59.1 mg/dL — ABNORMAL HIGH (ref 7.0–26.0)
CO2: 21 mEq/L — ABNORMAL LOW (ref 22–29)
Calcium: 10.6 mg/dL — ABNORMAL HIGH (ref 8.4–10.4)
Chloride: 111 mEq/L — ABNORMAL HIGH (ref 98–109)
Potassium: 5 mEq/L (ref 3.5–5.1)
Sodium: 141 mEq/L (ref 136–145)
Total Protein: 7.1 g/dL (ref 6.4–8.3)

## 2013-08-13 LAB — CBC WITH DIFFERENTIAL/PLATELET
Basophils Absolute: 0 10*3/uL (ref 0.0–0.1)
EOS%: 4.8 % (ref 0.0–7.0)
Eosinophils Absolute: 0.4 10*3/uL (ref 0.0–0.5)
HCT: 32.5 % — ABNORMAL LOW (ref 34.8–46.6)
HGB: 10.6 g/dL — ABNORMAL LOW (ref 11.6–15.9)
MCH: 33.2 pg (ref 25.1–34.0)
MONO#: 0.6 10*3/uL (ref 0.1–0.9)
NEUT%: 76.6 % (ref 38.4–76.8)
lymph#: 0.9 10*3/uL (ref 0.9–3.3)

## 2013-08-13 MED ORDER — DARBEPOETIN ALFA-POLYSORBATE 300 MCG/0.6ML IJ SOLN
300.0000 ug | Freq: Once | INTRAMUSCULAR | Status: AC
  Administered 2013-08-13: 300 ug via SUBCUTANEOUS
  Filled 2013-08-13: qty 0.6

## 2013-11-02 ENCOUNTER — Other Ambulatory Visit: Payer: Self-pay | Admitting: *Deleted

## 2013-11-02 DIAGNOSIS — C50319 Malignant neoplasm of lower-inner quadrant of unspecified female breast: Secondary | ICD-10-CM

## 2013-11-02 DIAGNOSIS — N189 Chronic kidney disease, unspecified: Principal | ICD-10-CM

## 2013-11-02 DIAGNOSIS — D631 Anemia in chronic kidney disease: Secondary | ICD-10-CM

## 2013-11-05 ENCOUNTER — Other Ambulatory Visit: Payer: Medicare PPO

## 2013-11-05 ENCOUNTER — Ambulatory Visit: Payer: Medicare PPO

## 2013-11-05 ENCOUNTER — Telehealth: Payer: Self-pay | Admitting: *Deleted

## 2013-11-05 NOTE — Telephone Encounter (Signed)
Called patient about missed appointment.  She had mixed up her dates.   Rescheduled for tomorrow 11/06/13.

## 2013-11-06 ENCOUNTER — Ambulatory Visit (HOSPITAL_BASED_OUTPATIENT_CLINIC_OR_DEPARTMENT_OTHER): Payer: Medicare PPO

## 2013-11-06 ENCOUNTER — Other Ambulatory Visit (HOSPITAL_BASED_OUTPATIENT_CLINIC_OR_DEPARTMENT_OTHER): Payer: Medicare PPO

## 2013-11-06 VITALS — BP 138/63 | HR 87 | Temp 98.2°F

## 2013-11-06 DIAGNOSIS — N185 Chronic kidney disease, stage 5: Secondary | ICD-10-CM

## 2013-11-06 DIAGNOSIS — D631 Anemia in chronic kidney disease: Secondary | ICD-10-CM

## 2013-11-06 DIAGNOSIS — N039 Chronic nephritic syndrome with unspecified morphologic changes: Secondary | ICD-10-CM

## 2013-11-06 DIAGNOSIS — N189 Chronic kidney disease, unspecified: Principal | ICD-10-CM

## 2013-11-06 LAB — CBC WITH DIFFERENTIAL/PLATELET
BASO%: 0.2 % (ref 0.0–2.0)
BASOS ABS: 0 10*3/uL (ref 0.0–0.1)
EOS%: 5.1 % (ref 0.0–7.0)
Eosinophils Absolute: 0.4 10*3/uL (ref 0.0–0.5)
HEMATOCRIT: 32.5 % — AB (ref 34.8–46.6)
HEMOGLOBIN: 10.6 g/dL — AB (ref 11.6–15.9)
LYMPH%: 19.7 % (ref 14.0–49.7)
MCH: 33 pg (ref 25.1–34.0)
MCHC: 32.6 g/dL (ref 31.5–36.0)
MCV: 101.4 fL — ABNORMAL HIGH (ref 79.5–101.0)
MONO#: 0.5 10*3/uL (ref 0.1–0.9)
MONO%: 6.2 % (ref 0.0–14.0)
NEUT#: 5 10*3/uL (ref 1.5–6.5)
NEUT%: 68.8 % (ref 38.4–76.8)
Platelets: 202 10*3/uL (ref 145–400)
RBC: 3.2 10*6/uL — ABNORMAL LOW (ref 3.70–5.45)
RDW: 17.4 % — ABNORMAL HIGH (ref 11.2–14.5)
WBC: 7.3 10*3/uL (ref 3.9–10.3)
lymph#: 1.4 10*3/uL (ref 0.9–3.3)

## 2013-11-06 MED ORDER — DARBEPOETIN ALFA-POLYSORBATE 500 MCG/ML IJ SOLN
300.0000 ug | Freq: Once | INTRAMUSCULAR | Status: AC
  Administered 2013-11-06: 300 ug via SUBCUTANEOUS
  Filled 2013-11-06: qty 1

## 2014-01-21 NOTE — Telephone Encounter (Signed)
Please see Visit Info comments 

## 2014-01-22 ENCOUNTER — Telehealth: Payer: Self-pay | Admitting: Oncology

## 2014-01-22 NOTE — Telephone Encounter (Signed)
, °

## 2014-01-27 ENCOUNTER — Other Ambulatory Visit: Payer: Self-pay | Admitting: *Deleted

## 2014-01-27 DIAGNOSIS — D631 Anemia in chronic kidney disease: Secondary | ICD-10-CM

## 2014-01-27 DIAGNOSIS — N189 Chronic kidney disease, unspecified: Secondary | ICD-10-CM

## 2014-01-27 DIAGNOSIS — C569 Malignant neoplasm of unspecified ovary: Secondary | ICD-10-CM

## 2014-01-28 ENCOUNTER — Other Ambulatory Visit (HOSPITAL_BASED_OUTPATIENT_CLINIC_OR_DEPARTMENT_OTHER): Payer: Medicare PPO

## 2014-01-28 ENCOUNTER — Ambulatory Visit: Payer: Medicare PPO

## 2014-01-28 ENCOUNTER — Ambulatory Visit (HOSPITAL_BASED_OUTPATIENT_CLINIC_OR_DEPARTMENT_OTHER): Payer: Medicare PPO

## 2014-01-28 ENCOUNTER — Ambulatory Visit: Payer: Medicare PPO | Admitting: Oncology

## 2014-01-28 VITALS — BP 136/62 | HR 85 | Temp 98.2°F

## 2014-01-28 DIAGNOSIS — C569 Malignant neoplasm of unspecified ovary: Secondary | ICD-10-CM

## 2014-01-28 DIAGNOSIS — N039 Chronic nephritic syndrome with unspecified morphologic changes: Secondary | ICD-10-CM

## 2014-01-28 DIAGNOSIS — N185 Chronic kidney disease, stage 5: Secondary | ICD-10-CM

## 2014-01-28 DIAGNOSIS — D631 Anemia in chronic kidney disease: Secondary | ICD-10-CM

## 2014-01-28 DIAGNOSIS — N189 Chronic kidney disease, unspecified: Principal | ICD-10-CM

## 2014-01-28 LAB — CBC WITH DIFFERENTIAL/PLATELET
BASO%: 0.1 % (ref 0.0–2.0)
BASOS ABS: 0 10*3/uL (ref 0.0–0.1)
EOS%: 4.2 % (ref 0.0–7.0)
Eosinophils Absolute: 0.3 10*3/uL (ref 0.0–0.5)
HEMATOCRIT: 33.2 % — AB (ref 34.8–46.6)
HEMOGLOBIN: 10.9 g/dL — AB (ref 11.6–15.9)
LYMPH%: 24.9 % (ref 14.0–49.7)
MCH: 32.7 pg (ref 25.1–34.0)
MCHC: 32.8 g/dL (ref 31.5–36.0)
MCV: 99.7 fL (ref 79.5–101.0)
MONO#: 0.4 10*3/uL (ref 0.1–0.9)
MONO%: 5.3 % (ref 0.0–14.0)
NEUT#: 4.7 10*3/uL (ref 1.5–6.5)
NEUT%: 65.5 % (ref 38.4–76.8)
Platelets: 170 10*3/uL (ref 145–400)
RBC: 3.33 10*6/uL — ABNORMAL LOW (ref 3.70–5.45)
RDW: 15.6 % — ABNORMAL HIGH (ref 11.2–14.5)
WBC: 7.2 10*3/uL (ref 3.9–10.3)
lymph#: 1.8 10*3/uL (ref 0.9–3.3)

## 2014-01-28 LAB — COMPREHENSIVE METABOLIC PANEL (CC13)
ALT: 24 U/L (ref 0–55)
AST: 27 U/L (ref 5–34)
Albumin: 3.5 g/dL (ref 3.5–5.0)
Alkaline Phosphatase: 139 U/L (ref 40–150)
Anion Gap: 9 mEq/L (ref 3–11)
BUN: 79 mg/dL — AB (ref 7.0–26.0)
CO2: 21 mEq/L — ABNORMAL LOW (ref 22–29)
Calcium: 10.1 mg/dL (ref 8.4–10.4)
Chloride: 111 mEq/L — ABNORMAL HIGH (ref 98–109)
Creatinine: 3.4 mg/dL (ref 0.6–1.1)
Glucose: 90 mg/dl (ref 70–140)
Potassium: 5 mEq/L (ref 3.5–5.1)
Sodium: 141 mEq/L (ref 136–145)
Total Bilirubin: 0.3 mg/dL (ref 0.20–1.20)
Total Protein: 6.7 g/dL (ref 6.4–8.3)

## 2014-01-28 MED ORDER — DARBEPOETIN ALFA-POLYSORBATE 500 MCG/ML IJ SOLN
300.0000 ug | Freq: Once | INTRAMUSCULAR | Status: AC
  Administered 2014-01-28: 300 ug via SUBCUTANEOUS
  Filled 2014-01-28: qty 1

## 2014-04-19 ENCOUNTER — Other Ambulatory Visit: Payer: Self-pay | Admitting: *Deleted

## 2014-04-19 DIAGNOSIS — D631 Anemia in chronic kidney disease: Secondary | ICD-10-CM

## 2014-04-19 DIAGNOSIS — C50319 Malignant neoplasm of lower-inner quadrant of unspecified female breast: Secondary | ICD-10-CM

## 2014-04-19 DIAGNOSIS — C569 Malignant neoplasm of unspecified ovary: Secondary | ICD-10-CM

## 2014-04-19 DIAGNOSIS — N189 Chronic kidney disease, unspecified: Secondary | ICD-10-CM

## 2014-04-22 ENCOUNTER — Ambulatory Visit (HOSPITAL_BASED_OUTPATIENT_CLINIC_OR_DEPARTMENT_OTHER): Payer: Medicare PPO

## 2014-04-22 ENCOUNTER — Telehealth: Payer: Self-pay | Admitting: Oncology

## 2014-04-22 ENCOUNTER — Other Ambulatory Visit: Payer: Self-pay | Admitting: *Deleted

## 2014-04-22 ENCOUNTER — Ambulatory Visit (HOSPITAL_BASED_OUTPATIENT_CLINIC_OR_DEPARTMENT_OTHER): Payer: Medicare PPO | Admitting: Oncology

## 2014-04-22 ENCOUNTER — Other Ambulatory Visit (HOSPITAL_BASED_OUTPATIENT_CLINIC_OR_DEPARTMENT_OTHER): Payer: Medicare PPO

## 2014-04-22 VITALS — BP 166/77 | HR 89 | Temp 98.2°F | Resp 20 | Ht 67.0 in | Wt 132.5 lb

## 2014-04-22 DIAGNOSIS — D631 Anemia in chronic kidney disease: Secondary | ICD-10-CM

## 2014-04-22 DIAGNOSIS — C50319 Malignant neoplasm of lower-inner quadrant of unspecified female breast: Secondary | ICD-10-CM

## 2014-04-22 DIAGNOSIS — N189 Chronic kidney disease, unspecified: Principal | ICD-10-CM

## 2014-04-22 DIAGNOSIS — N039 Chronic nephritic syndrome with unspecified morphologic changes: Secondary | ICD-10-CM

## 2014-04-22 DIAGNOSIS — C569 Malignant neoplasm of unspecified ovary: Secondary | ICD-10-CM

## 2014-04-22 DIAGNOSIS — Z853 Personal history of malignant neoplasm of breast: Secondary | ICD-10-CM

## 2014-04-22 DIAGNOSIS — Z8543 Personal history of malignant neoplasm of ovary: Secondary | ICD-10-CM

## 2014-04-22 DIAGNOSIS — N184 Chronic kidney disease, stage 4 (severe): Secondary | ICD-10-CM

## 2014-04-22 LAB — CBC WITH DIFFERENTIAL/PLATELET
BASO%: 0.4 % (ref 0.0–2.0)
BASOS ABS: 0 10*3/uL (ref 0.0–0.1)
EOS ABS: 0.4 10*3/uL (ref 0.0–0.5)
EOS%: 6.2 % (ref 0.0–7.0)
HEMATOCRIT: 29.6 % — AB (ref 34.8–46.6)
HGB: 9.6 g/dL — ABNORMAL LOW (ref 11.6–15.9)
LYMPH%: 14.7 % (ref 14.0–49.7)
MCH: 33.2 pg (ref 25.1–34.0)
MCHC: 32.3 g/dL (ref 31.5–36.0)
MCV: 102.7 fL — AB (ref 79.5–101.0)
MONO#: 0.4 10*3/uL (ref 0.1–0.9)
MONO%: 6.6 % (ref 0.0–14.0)
NEUT%: 72.1 % (ref 38.4–76.8)
NEUTROS ABS: 4.9 10*3/uL (ref 1.5–6.5)
Platelets: 219 10*3/uL (ref 145–400)
RBC: 2.88 10*6/uL — ABNORMAL LOW (ref 3.70–5.45)
RDW: 17.1 % — ABNORMAL HIGH (ref 11.2–14.5)
WBC: 6.7 10*3/uL (ref 3.9–10.3)
lymph#: 1 10*3/uL (ref 0.9–3.3)

## 2014-04-22 LAB — COMPREHENSIVE METABOLIC PANEL (CC13)
ALBUMIN: 3.3 g/dL — AB (ref 3.5–5.0)
ALT: 27 U/L (ref 0–55)
AST: 25 U/L (ref 5–34)
Alkaline Phosphatase: 135 U/L (ref 40–150)
Anion Gap: 7 mEq/L (ref 3–11)
BILIRUBIN TOTAL: 0.43 mg/dL (ref 0.20–1.20)
BUN: 76.3 mg/dL — ABNORMAL HIGH (ref 7.0–26.0)
CO2: 22 mEq/L (ref 22–29)
Calcium: 10.3 mg/dL (ref 8.4–10.4)
Chloride: 109 mEq/L (ref 98–109)
Creatinine: 3.3 mg/dL (ref 0.6–1.1)
GLUCOSE: 93 mg/dL (ref 70–140)
POTASSIUM: 4.9 meq/L (ref 3.5–5.1)
Sodium: 139 mEq/L (ref 136–145)
Total Protein: 6.7 g/dL (ref 6.4–8.3)

## 2014-04-22 LAB — IRON AND TIBC CHCC
%SAT: 32 % (ref 21–57)
IRON: 61 ug/dL (ref 41–142)
TIBC: 193 ug/dL — AB (ref 236–444)
UIBC: 131 ug/dL (ref 120–384)

## 2014-04-22 MED ORDER — DARBEPOETIN ALFA-POLYSORBATE 300 MCG/0.6ML IJ SOLN
300.0000 ug | Freq: Once | INTRAMUSCULAR | Status: AC
  Administered 2014-04-22: 300 ug via SUBCUTANEOUS
  Filled 2014-04-22: qty 0.6

## 2014-04-22 NOTE — Progress Notes (Signed)
.[name]   DOB: Aug 08, 1931  MR#: [id number]  QBH#:[id number]  PCP: Geoffery Lyons, MD GYN: Jackqulyn Livings SU: Excell Seltzer OTHER MD: Jamal Maes   HISTORY OF PRESENT ILLNESS: The patient had routine annual mammography at [facility] 02/02/2012. There was a developing density in the inferior aspect of the right breast and on may 02/07/2012 the patient was brought back for additional views. This confirmed a new multilobulated density in the lower inner quadrant measuring 3.5 cm. By ultrasound this was multilobulated and hypoechoic. It measured at least 2 cm. Biopsy of this mass was obtained the the next day, and showed ([id number]) and invasive ductal carcinoma, grade 2 or 3, which was triple negative, with an MIB-1 of 75%.  Breast specific gamma imaging was obtained Feb 14 2012 and showed only a 2 cm focus of activity in the region of prior biopsy. The patient was evaluated at the multidisciplinary breast cancer clinic on 02/16/2012. Her subsequent history is as detailed below.  INTERVAL HISTORY: [name] returns today for followup of her right breast cancer and her anemia of renal failure. The interval history is unremarkable. She is planning a trip to [place] with her husband. They her 2 daughters are currently in [place] and will meet them they are. This is planned for December. She wants to make sure her hemoglobin is optimized by  REVIEW OF SYSTEMS: [name] feels a little dizzy. She thinks, probably correctly, this may be due to her blood pressure medications. She denies unusual headaches, double vision, or other unusual visual changes, problems with nausea or vomiting. She does describe herself as a bit more fatigued than usual. She is not aware of any bleeding, fevers, rash, or unexplained weight gain or weight loss. A detailed review of systems today was otherwise stable  PAST MEDICAL HISTORY: Past Medical History  Diagnosis Date  . Gout   . Fatigue   . Hypertension   . Chronic  kidney disease     stage 4 renal disease-on transplant list  . S/P radiation therapy 04/10/12 - 05/05/12    Right Breast/40.05Gy/15 Fractions with Boost of 10gy/5 Fractions  . Atrophic vaginitis 3/71991  . Fibroadenoma 02/17/1989    right-stable   . Ovarian cancer 10/2002  . Breast cancer 2013    invasive breast cancer  . Anemia   hypotheyroidism  PAST SURGICAL HISTORY: Past Surgical History  Procedure Laterality Date  . Appendectomy    . Total abdominal hysterectomy w/ bilateral salpingoophorectomy  1/04    Stage IC ovarian cancer  . Shoulder arthroscopy  2010    rt  . Eye surgery      both cataracts  . Breast biopsy  02/08/12    Right Breast - Invasive Carcinoma, ER 0%, PR 0%, Ki6 75%  . Mastectomy partial / lumpectomy w/ axillary lymphadenectomy  03/08/12    Right Breast- Invasive Ductal: 0/1 Node Negative  s/p TAH-BSO  FAMILY HISTORY Family History  Problem Relation Age of Onset  . Ovarian cancer Sister    the patient's father died at the age of 65 from renal failure likely secondary to hypertension. He did not have diabetes. The patient's mother died at the age of 48 from myocardial infarction. The patient had no brothers, one sister. That sister was diagnosed with ovarian cancer at age 76. She is currently 79 and in remission.  GYNECOLOGIC HISTORY: Menarche age 32, first live birth age 45, she is GX P3, change of life in approximately 22. She used hormone replacement  for about 10 years.  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 Her husband [name] is a retired professor of speech in communications. Daughter [name] lives in [place]. She is disabled. Daughter [name] teaches at the [employer] school in [place]. Daughter [name] is a school principal in [place], [place]. The patient has 2 grandchildren. She attends the [employer] DIRECTIVES:  in place  HEALTH MAINTENANCE: History  Substance Use Topics  . Smoking status: Former Smoker -- 0.50 packs/day    Quit date: 03/01/1963  . Smokeless tobacco: Never Used  . Alcohol Use: No     Colonoscopy: Fuller Plan 2012  PAP: Jackqulyn Livings 2012  Bone density:  Lipid panel:  Allergies  Allergen Reactions  . Nsaids     Pt. Does not want this type drug due to impaired renal function  . Renal [Actical]     DOES NOT WANT ANY MEDS THAT EFFECT RENAL FUNTION    Current Outpatient Prescriptions  Medication Sig Dispense Refill  . alendronate (FOSAMAX) 10 MG tablet TAKE 1 TABLET DAILY AS DIRECTED.  30 tablet  12  . allopurinol (ZYLOPRIM) 100 MG tablet Take 100 mg by mouth daily.       Marland Kitchen aspirin 81 MG tablet Take 81 mg by mouth daily.      . darbepoetin (ARANESP) 100 MCG/0.5ML SOLN Inject into the skin every 3 (three) months.      . Iron-Vitamin C (IRON 100/C PO) Take by mouth daily.      Marland Kitchen losartan (COZAAR) 50 MG tablet Take 50 mg by mouth daily.      . Multiple Vitamins-Minerals (MULTIVITAMIN PO) Take by mouth daily.      Marland Kitchen SYNTHROID 25 MCG tablet Take 25 mcg by mouth daily.       Marland Kitchen ZEMPLAR 1 MCG capsule 1 tab every other day       No current facility-administered medications for this visit.    OBJECTIVE: Elderly white woman in no acute distress Filed Vitals:   04/22/14 1410  BP: 166/77  Pulse: 89  Temp: 98.2 F (36.8 C)  Resp: 20     Body mass index is 20.75 kg/(m^2).    ECOG FS: 1 Filed Weights   04/22/14 1410  Weight: 132 lb 8 oz (60.102 kg)   Sclerae unicteric, pupils equal and reactive  Oropharynx clear and moist No cervical or supraclavicular adenopathy Lungs no rales or rhonchi Heart regular rate and rhythm Abdomen soft, nontender, with positive bowel sounds MSK no focal spinal tenderness, no upper extremity lymphedema Neuro: nonfocal, well oriented, positive affect Breasts: Right breast is status post lumpectomy and radiation. There is no evidence of local  recurrence. The right axilla is benign. The left breast is unremarkable.  LAB RESULTS: Lab Results  Component Value Date   WBC 6.7 04/22/2014   NEUTROABS 4.9 04/22/2014   HGB 9.6* 04/22/2014   HCT 29.6* 04/22/2014   MCV 102.7* 04/22/2014   PLT 219 04/22/2014      Chemistry      Component Value Date/Time   NA 141 01/28/2014 1147   NA 137 03/08/2012 1206   K 5.0 01/28/2014 1147   K 5.5* 03/08/2012 1206   CL 105 02/15/2013 1118   CL 113* 03/08/2012 1206   CO2 21* 01/28/2014 1147   CO2 24 03/06/2012 0935   BUN 79.0* 01/28/2014 1147   BUN 73*  03/08/2012 1206   CREATININE 3.4* 01/28/2014 1147   CREATININE 3.50* 03/08/2012 1206      Component Value Date/Time   CALCIUM 10.1 01/28/2014 1147   CALCIUM 9.5 03/06/2012 0935   CALCIUM 9.5 04/05/2011 0844   ALKPHOS 139 01/28/2014 1147   ALKPHOS 83 02/16/2012 1210   AST 27 01/28/2014 1147   AST 23 02/16/2012 1210   ALT 24 01/28/2014 1147   ALT 20 02/16/2012 1210   BILITOT 0.30 01/28/2014 1147   BILITOT 0.3 02/16/2012 1210       Lab Results  Component Value Date   LABCA2 26 11/14/2012    STUDIES: Mamm[facility] at [facility] 02/18/2014 showed no evidence of malignancy   ASSESSMENT: 80 y.o. BRCA 1-2 negative [place] woman  (1) status post TAH/BSO January of 2004 for a stage IC ovarian cancer, followed by 3 cycles of carboplatin/docetaxel completed March of 2004  (2) stage 4/5 chronic kidney disease, possibly doe to hypertension, earlier chemotherapy, or family genetics; on transplant list;  followed by Dr. Hassell Done.  (3) anemia of renal failure, receiving Aranesp as needed every 3 months  (4) s/p right lumpectomy and sentinel lymph node sampling 03/08/2012 for a  T2 N0, stage IIA invasive ductal carcinoma, grade 3, triple negative, with an MIB-1 of 75%.  (5) completed radiation therapy 05/05/2012  (6) decided against adjuvant chemotherapy, followed with observation   PLAN:  Annya is doing well as far as her breast cancer is concerned. As far as her anemia, her MCV  is slightly up and her hemoglobin slightly down. I think she may benefit from Aranesp more frequently than every 3 months, and we are going to try every 2 months. This means she would be treated now, in September and then in November, setting her out for her December trip to [place].  Otherwise I will continue to see her on a once a year basis, more frequently only as needed. She will have her next mammogram may of 2016. She knows to call for any problems that may develop before her next visit here.   MAGRINAT,GUSTAV C    04/22/2014

## 2014-04-22 NOTE — Telephone Encounter (Signed)
per pof to sch pt appt-sch & gave pt appt sch

## 2014-04-23 NOTE — Addendum Note (Signed)
Addended by: Amelia Jo I on: 04/23/2014 01:14 PM   Modules accepted: Medications

## 2014-04-25 ENCOUNTER — Telehealth: Payer: Self-pay | Admitting: Oncology

## 2014-04-25 NOTE — Telephone Encounter (Signed)
pt wanted to ck to make sure the 8/18 appt was not @ [facility]-adv it was not. Adv her injections statr in Sept as sch-pt understood

## 2014-05-21 ENCOUNTER — Ambulatory Visit: Payer: Medicare PPO | Admitting: Obstetrics & Gynecology

## 2014-06-04 ENCOUNTER — Encounter: Payer: Self-pay | Admitting: Obstetrics & Gynecology

## 2014-06-04 ENCOUNTER — Ambulatory Visit (INDEPENDENT_AMBULATORY_CARE_PROVIDER_SITE_OTHER): Payer: Medicare PPO | Admitting: Obstetrics & Gynecology

## 2014-06-04 VITALS — BP 122/78 | HR 80 | Resp 16 | Ht 67.75 in | Wt 132.0 lb

## 2014-06-04 DIAGNOSIS — K649 Unspecified hemorrhoids: Secondary | ICD-10-CM

## 2014-06-04 DIAGNOSIS — Z01419 Encounter for gynecological examination (general) (routine) without abnormal findings: Secondary | ICD-10-CM

## 2014-06-04 DIAGNOSIS — K648 Other hemorrhoids: Secondary | ICD-10-CM

## 2014-06-04 DIAGNOSIS — C569 Malignant neoplasm of unspecified ovary: Secondary | ICD-10-CM

## 2014-06-04 NOTE — Progress Notes (Addendum)
80 y.o. G3P3 MarriedCaucasianF here for annual exam.  Doing well.  No vaginal bleeding.  Going to [place] for the Christmas holidays.  [facility].  Going to the travel clinic to do this.     Having some issues with hemorrhoids--trouble with feeling clean.  Would like to see someone.  Patient's last menstrual period was 10/04/2002.          Sexually active: No.  The current method of family planning is status post hysterectomy.    Exercising: Yes.    walking some Smoker:  Former smoker  Health Maintenance: Pap:  01/18/11 WNL History of abnormal Pap:  yes MMG:  02/18/14 diag-normal Colonoscopy:  2012, Dr. Fuller Plan, f/u 5 years BMD:   5/14-osteoporosis, but stable TDaP:  [facility]  Screening Labs: Dr Lorrene Reid, Hb today: Dr Lorrene Reid, Urine today: Dr Lorrene Reid   reports that she quit smoking about 51 years ago. She has never used smokeless tobacco. She reports that she does not drink alcohol or use illicit drugs.  Past Medical History  Diagnosis Date  . Gout   . Fatigue   . Hypertension   . Chronic kidney disease     stage 4 renal disease-on transplant list  . S/P radiation therapy 04/10/12 - 05/05/12    Right Breast/40.05Gy/15 Fractions with Boost of 10gy/5 Fractions  . Atrophic vaginitis 3/71991  . Fibroadenoma 02/17/1989    right-stable   . Ovarian cancer 10/2002  . Breast cancer 2013    invasive breast cancer  . Anemia     Past Surgical History  Procedure Laterality Date  . Appendectomy    . Total abdominal hysterectomy w/ bilateral salpingoophorectomy  1/04    Stage IC ovarian cancer  . Shoulder arthroscopy  2010    rt  . Eye surgery      both cataracts  . Breast biopsy  02/08/12    Right Breast - Invasive Carcinoma, ER 0%, PR 0%, Ki6 75%  . Mastectomy partial / lumpectomy w/ axillary lymphadenectomy  03/08/12    Right Breast- Invasive Ductal: 0/1 Node Negative    Current Outpatient Prescriptions   Medication Sig Dispense Refill  . alendronate (FOSAMAX) 10 MG tablet TAKE 1 TABLET DAILY AS DIRECTED.  30 tablet  12  . allopurinol (ZYLOPRIM) 100 MG tablet Take 100 mg by mouth daily.       Marland Kitchen aspirin 81 MG tablet Take 81 mg by mouth daily.      Marland Kitchen atorvastatin (LIPITOR) 10 MG tablet 10 mg daily.      . darbepoetin (ARANESP) 100 MCG/0.5ML SOLN Inject into the skin every 3 (three) months.      . Iron-Vitamin C (IRON 100/C PO) Take by mouth daily.      Marland Kitchen losartan (COZAAR) 50 MG tablet Take 50 mg by mouth daily.      . Multiple Vitamins-Minerals (MULTIVITAMIN PO) Take by mouth daily.      Marland Kitchen SYNTHROID 25 MCG tablet Take 25 mcg by mouth daily.       Marland Kitchen ZEMPLAR 1 MCG capsule 1 tab every other day       No current facility-administered medications for this visit.    Family History  Problem Relation Age of Onset  . Ovarian cancer Sister     ROS:  Pertinent items are noted in HPI.  Otherwise, a comprehensive ROS was negative.  Exam:   BP 122/78  Pulse 80  Resp 16  Ht  5' 7.75" (1.721 m)  Wt 132 lb (59.875 kg)  BMI 20.22 kg/m2  LMP 10/04/2002   Height: 5' 7.75" (172.1 cm)  Ht Readings from Last 3 Encounters:  06/04/14 5' 7.75" (1.721 m)  04/22/14 5' 7"  (1.702 m)  05/21/13 5' 7"  (1.702 m)    General appearance: alert, cooperative and appears stated age Head: Normocephalic, without obvious abnormality, atraumatic Neck: no adenopathy, supple, symmetrical, trachea midline and thyroid normal to inspection and palpation Lungs: clear to auscultation bilaterally Breasts: normal appearance, no masses or tenderness, except for well healed scar on right breast and minimal thickening due to radiation changes Heart: regular rate and rhythm Abdomen: soft, non-tender; bowel sounds normal; no masses,  no organomegaly Extremities: extremities normal, atraumatic, no cyanosis or edema Skin: Skin color, texture, turgor normal. No rashes or lesions Lymph nodes: Cervical, supraclavicular, and axillary  nodes normal. No abnormal inguinal nodes palpated Neurologic: Grossly normal   Pelvic: External genitalia:  no lesions              Urethra:  normal appearing urethra with no masses, tenderness or lesions              Bartholins and Skenes: normal                 Vagina: normal appearing vagina with normal color and discharge, no lesions              Cervix: absent              Pap taken: No. Bimanual Exam:  Uterus:  uterus absent              Adnexa: no mass, fullness, tenderness               Rectovaginal: Confirms               Anus:  normal sphincter tone, prolapsed hemorrhoid  A:  Well Woman with normal exam  PMP, no HRT  H/O IC ovarian cancer 1/04  Breast cancer (triple negative) 8/13  BRCA I/II negative  Chronic renal failure (creatinine 2.9)  Hypertension  Osteoporosis Hemorrhoid  P: Mammogram yearly.  Ca-125 today. After pt was released from Dr. Fermin Schwab, was advised yearly ca-125 with physical exam and MMG.  pap smear not needed.  PCP:  Dr. Reynaldo Minium Fosamax 105m qday. BMD stable 5/14.  Will refer to Dr. AHenri Medalfor consultation.  return annually or prn  An After Visit Summary was printed and given to the patient.

## 2014-06-05 MED ORDER — ALENDRONATE SODIUM 10 MG PO TABS: ORAL_TABLET | ORAL | Status: DC

## 2014-06-06 LAB — CA 125: CA 125: 26 U/mL (ref <35)

## 2014-06-17 ENCOUNTER — Ambulatory Visit: Payer: Medicare PPO

## 2014-06-17 ENCOUNTER — Ambulatory Visit (HOSPITAL_BASED_OUTPATIENT_CLINIC_OR_DEPARTMENT_OTHER): Payer: Medicare PPO

## 2014-06-17 DIAGNOSIS — N189 Chronic kidney disease, unspecified: Secondary | ICD-10-CM

## 2014-06-17 DIAGNOSIS — D631 Anemia in chronic kidney disease: Secondary | ICD-10-CM

## 2014-06-17 DIAGNOSIS — N183 Chronic kidney disease, stage 3 (moderate): Principal | ICD-10-CM

## 2014-06-17 DIAGNOSIS — C50319 Malignant neoplasm of lower-inner quadrant of unspecified female breast: Secondary | ICD-10-CM

## 2014-06-17 LAB — CBC WITH DIFFERENTIAL/PLATELET
BASO%: 0.3 % (ref 0.0–2.0)
Basophils Absolute: 0 10*3/uL (ref 0.0–0.1)
EOS%: 5.7 % (ref 0.0–7.0)
Eosinophils Absolute: 0.4 10*3/uL (ref 0.0–0.5)
HCT: 34.2 % — ABNORMAL LOW (ref 34.8–46.6)
HGB: 11 g/dL — ABNORMAL LOW (ref 11.6–15.9)
LYMPH#: 1.1 10*3/uL (ref 0.9–3.3)
LYMPH%: 15.9 % (ref 14.0–49.7)
MCH: 32.5 pg (ref 25.1–34.0)
MCHC: 32 g/dL (ref 31.5–36.0)
MCV: 101.6 fL — ABNORMAL HIGH (ref 79.5–101.0)
MONO#: 0.5 10*3/uL (ref 0.1–0.9)
MONO%: 7.1 % (ref 0.0–14.0)
NEUT#: 4.8 10*3/uL (ref 1.5–6.5)
NEUT%: 71 % (ref 38.4–76.8)
PLATELETS: 198 10*3/uL (ref 145–400)
RBC: 3.37 10*6/uL — ABNORMAL LOW (ref 3.70–5.45)
RDW: 17.3 % — ABNORMAL HIGH (ref 11.2–14.5)
WBC: 6.8 10*3/uL (ref 3.9–10.3)

## 2014-06-17 MED ORDER — DARBEPOETIN ALFA-POLYSORBATE 500 MCG/ML IJ SOLN: 300.0000 ug | Freq: Once | INTRAMUSCULAR | Status: DC

## 2014-06-20 ENCOUNTER — Other Ambulatory Visit: Payer: Self-pay | Admitting: Oncology

## 2014-06-20 ENCOUNTER — Telehealth: Payer: Self-pay | Admitting: Oncology

## 2014-06-20 NOTE — Telephone Encounter (Signed)
cld pt & left a message for appt time & date

## 2014-06-26 ENCOUNTER — Other Ambulatory Visit: Payer: Self-pay | Admitting: Oncology

## 2014-07-02 ENCOUNTER — Telehealth: Payer: Self-pay | Admitting: Oncology

## 2014-07-02 NOTE — Telephone Encounter (Signed)
per pof to sch pt appt-gave pt copy of sch °

## 2014-07-15 ENCOUNTER — Other Ambulatory Visit: Payer: Medicare PPO

## 2014-07-15 ENCOUNTER — Ambulatory Visit (HOSPITAL_BASED_OUTPATIENT_CLINIC_OR_DEPARTMENT_OTHER): Payer: Medicare PPO

## 2014-07-15 ENCOUNTER — Other Ambulatory Visit (HOSPITAL_BASED_OUTPATIENT_CLINIC_OR_DEPARTMENT_OTHER): Payer: Medicare PPO

## 2014-07-15 ENCOUNTER — Ambulatory Visit: Payer: Medicare PPO

## 2014-07-15 ENCOUNTER — Other Ambulatory Visit: Payer: Self-pay | Admitting: Oncology

## 2014-07-15 VITALS — BP 139/69 | HR 83 | Temp 98.2°F

## 2014-07-15 DIAGNOSIS — D631 Anemia in chronic kidney disease: Secondary | ICD-10-CM

## 2014-07-15 DIAGNOSIS — N189 Chronic kidney disease, unspecified: Principal | ICD-10-CM

## 2014-07-15 DIAGNOSIS — N184 Chronic kidney disease, stage 4 (severe): Secondary | ICD-10-CM

## 2014-07-15 LAB — CBC WITH DIFFERENTIAL/PLATELET
BASO%: 0.4 % (ref 0.0–2.0)
Basophils Absolute: 0 10*3/uL (ref 0.0–0.1)
EOS ABS: 0.4 10*3/uL (ref 0.0–0.5)
EOS%: 5.4 % (ref 0.0–7.0)
HCT: 31.9 % — ABNORMAL LOW (ref 34.8–46.6)
HEMOGLOBIN: 10.3 g/dL — AB (ref 11.6–15.9)
LYMPH%: 15.4 % (ref 14.0–49.7)
MCH: 33.2 pg (ref 25.1–34.0)
MCHC: 32.2 g/dL (ref 31.5–36.0)
MCV: 103 fL — ABNORMAL HIGH (ref 79.5–101.0)
MONO#: 0.4 10*3/uL (ref 0.1–0.9)
MONO%: 6 % (ref 0.0–14.0)
NEUT%: 72.8 % (ref 38.4–76.8)
NEUTROS ABS: 5.2 10*3/uL (ref 1.5–6.5)
Platelets: 192 10*3/uL (ref 145–400)
RBC: 3.1 10*6/uL — AB (ref 3.70–5.45)
RDW: 16.6 % — AB (ref 11.2–14.5)
WBC: 7.1 10*3/uL (ref 3.9–10.3)
lymph#: 1.1 10*3/uL (ref 0.9–3.3)

## 2014-07-15 MED ORDER — DARBEPOETIN ALFA-POLYSORBATE 300 MCG/0.6ML IJ SOLN
300.0000 ug | Freq: Once | INTRAMUSCULAR | Status: AC
Start: 2014-07-15 — End: 2014-07-15
  Administered 2014-07-15: 300 ug via SUBCUTANEOUS
  Filled 2014-07-15: qty 0.6

## 2014-08-05 ENCOUNTER — Encounter: Payer: Self-pay | Admitting: Obstetrics & Gynecology

## 2014-08-07 ENCOUNTER — Other Ambulatory Visit: Payer: Medicare PPO

## 2014-08-07 ENCOUNTER — Ambulatory Visit: Payer: Medicare PPO

## 2014-10-07 ENCOUNTER — Non-Acute Institutional Stay (SKILLED_NURSING_FACILITY): Payer: Medicare PPO | Admitting: Internal Medicine

## 2014-10-07 ENCOUNTER — Ambulatory Visit: Payer: Medicare PPO

## 2014-10-07 ENCOUNTER — Other Ambulatory Visit: Payer: Medicare PPO

## 2014-10-07 DIAGNOSIS — A0472 Enterocolitis due to Clostridium difficile, not specified as recurrent: Secondary | ICD-10-CM

## 2014-10-07 DIAGNOSIS — B370 Candidal stomatitis: Secondary | ICD-10-CM

## 2014-10-07 DIAGNOSIS — K219 Gastro-esophageal reflux disease without esophagitis: Secondary | ICD-10-CM

## 2014-10-07 DIAGNOSIS — G4700 Insomnia, unspecified: Secondary | ICD-10-CM

## 2014-10-07 DIAGNOSIS — R1310 Dysphagia, unspecified: Secondary | ICD-10-CM

## 2014-10-07 DIAGNOSIS — S72142S Displaced intertrochanteric fracture of left femur, sequela: Secondary | ICD-10-CM

## 2014-10-07 DIAGNOSIS — E039 Hypothyroidism, unspecified: Secondary | ICD-10-CM

## 2014-10-07 DIAGNOSIS — A047 Enterocolitis due to Clostridium difficile: Secondary | ICD-10-CM

## 2014-10-07 DIAGNOSIS — R569 Unspecified convulsions: Secondary | ICD-10-CM

## 2014-10-07 DIAGNOSIS — R627 Adult failure to thrive: Secondary | ICD-10-CM

## 2014-10-07 DIAGNOSIS — R5381 Other malaise: Secondary | ICD-10-CM

## 2014-10-07 DIAGNOSIS — S42302S Unspecified fracture of shaft of humerus, left arm, sequela: Secondary | ICD-10-CM

## 2014-10-07 NOTE — Progress Notes (Signed)
Patient ID: [name], female   DOB: Feb 28, 1931, 81 y.o.   MRN: [id number]     [facility]   PCP: ARONSON,RICHARD A, MD  Code Status: DNR  Allergies  Allergen Reactions  . Nsaids     Pt. Does not want this type drug due to impaired renal function  . Renal [Actical]     DOES NOT WANT ANY MEDS THAT EFFECT RENAL FUNTION    Chief Complaint  Patient presents with  . New Admit To SNF     HPI:  81 y/o female patient is here for STR post hospital admission from [facility] extended care in [place], [place]. She was there for a visit when she had a witnessed seizure and a fall. She had left proximal humeral fracture and left hip fracture. Conservative management with sling on left arm and pain management was decided upon. She was seen by neurology, MRI brain showed no acute ischemic changes. 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 Her aspirin and lipitor were continued. She had EEG showing no epileptiform activit. She was started on keppra and then sent to SNF there for rehabilitation. She was readmitted to the hospital there on 09/19/14 with hyponatremia and hypokalemia. She responded well to hypertonic saline. Her aspirin was held with her being on heparin and statin was discontinued with LDL at goal. She was put on dysphagia diet with her difficulty swallowing and then sent to [facility] to continue STR. She is seen in her room today. She has sling on her left hand. She mentions that with movement her pain is 10/10. She is currently on tylenol for pain. Pt and family do not want any narcotics. She has generalized weakness and easy fatigue. She has poor po intake and difficulty swallowing.  Review of Systems:  Constitutional: Negative for fever, chills, diaphoresis.  HENT: Negative for congestion  Respiratory: Negative for cough, shortness of breath and wheezing.   Cardiovascular: Negative for chest pain,  palpitations, leg swelling.  Gastrointestinal: Negative for heartburn, nausea, vomiting, abdominal pain Genitourinary: Negative for dysuria Musculoskeletal: Negative for back pain, falls in facility Skin: Negative for itching, rash.  Neurological: Negative for dizziness, tingling, focal weakness and headaches.  Psychiatric/Behavioral: Negative for depression  Past Medical History  Diagnosis Date  . Gout   . Fatigue   . Hypertension   . Chronic kidney disease     stage 4 renal disease-on transplant list  . S/P radiation therapy 04/10/12 - 05/05/12    Right Breast/40.05Gy/15 Fractions with Boost of 10gy/5 Fractions  . Atrophic vaginitis 3/71991  . Fibroadenoma 02/17/1989    right-stable   . Ovarian cancer 10/2002  . Breast cancer 2013    invasive breast cancer  . Anemia    Past Surgical History  Procedure Laterality Date  . Appendectomy    . Total abdominal hysterectomy w/ bilateral salpingoophorectomy  1/04    Stage IC ovarian cancer  . Shoulder arthroscopy  2010    rt  . Eye surgery      both cataracts  . Breast biopsy  02/08/12    Right Breast - Invasive Carcinoma, ER 0%, PR 0%, Ki6 75%  . Mastectomy partial / lumpectomy w/ axillary lymphadenectomy  03/08/12    Right Breast- Invasive Ductal: 0/1 Node Negative   Social History:   reports that she quit smoking about 51 years ago. She has never used smokeless tobacco. She reports that she does not drink  alcohol or use illicit drugs.  Family History  Problem Relation Age of Onset  . Ovarian cancer Sister     Medications: Patient's Medications  New Prescriptions   No medications on file  Previous Medications   ACETAMINOPHEN (TYLENOL) 325 MG TABLET    Take 650 mg by mouth every 4 (four) hours as needed.   HEPARIN 86578 UNIT/ML INJECTION    Inject 5,000 Units into the skin 3 (three) times daily.   LACTULOSE (CHRONULAC) 10 GM/15ML SOLUTION    Take by mouth daily as needed for mild constipation.   LEVETIRACETAM (KEPPRA) 500 MG  TABLET    Take 500 mg by mouth 2 (two) times daily.   MELATONIN 3 MG CAPS    Take 1 capsule by mouth daily.   MIRTAZAPINE (REMERON) 7.5 MG TABLET    Take 7.5 mg by mouth at bedtime.   MULTIPLE VITAMINS-MINERALS (MULTIVITAMIN PO)    Take by mouth daily.   NYSTATIN (MYCOSTATIN) 100000 UNIT/ML SUSPENSION    Take 5 mLs by mouth 4 (four) times daily.   OMEPRAZOLE (PRILOSEC) 20 MG CAPSULE    Take 20 mg by mouth daily.   SACCHAROMYCES BOULARDII (FLORASTOR) 250 MG CAPSULE    Take 250 mg by mouth 2 (two) times daily.   SYNTHROID 25 MCG TABLET    Take 25 mcg by mouth daily.    VANCOMYCIN (VANCOCIN) 125 MG CAPSULE    Take 125 mg by mouth 3 (three) times daily.  Modified Medications   No medications on file  Discontinued Medications   ALENDRONATE (FOSAMAX) 10 MG TABLET    TAKE 1 TABLET DAILY AS DIRECTED.   ALLOPURINOL (ZYLOPRIM) 100 MG TABLET    Take 100 mg by mouth daily.    ASPIRIN 81 MG TABLET    Take 81 mg by mouth daily.   ATORVASTATIN (LIPITOR) 10 MG TABLET    10 mg daily.   DARBEPOETIN (ARANESP) 100 MCG/0.5ML SOLN    Inject into the skin every 3 (three) months.   IRON-VITAMIN C (IRON 100/C PO)    Take by mouth daily.   LOSARTAN (COZAAR) 50 MG TABLET    Take 50 mg by mouth daily.   ZEMPLAR 1 MCG CAPSULE    1 tab every other day     Physical Exam: Filed Vitals:   10/07/14 1824  BP: 136/60  Pulse: 64  Temp: 97 F (36.1 C)  Resp: 17  SpO2: 96%    General- elderly female in no acute distress, frail Head- atraumatic, normocephalic Eyes- no pallor, no icterus, no discharge Neck- no cervical lymphadenopathy Throat- moist mucus membrane Cardiovascular- normal s1,s2, no murmurs, palpable dorsalis pedis Respiratory- bilateral clear to auscultation, no wheeze, no rhonchi, no crackles, no use of accessory muscles Abdomen- bowel sounds present, soft, non tender, foley in place Musculoskeletal- left arm in sling, able to move her fingers, good radial pulse, left hip ROM limited, no leg edema    Neurological- no focal deficit Skin- warm and dry Psychiatry- alert and oriented, normal mood and affect   Labs reviewed: Basic Metabolic Panel:  Recent Labs  01/28/14 1147 04/22/14 1347  NA 141 139  K 5.0 4.9  CO2 21* 22  GLUCOSE 90 93  BUN 79.0* 76.3*  CREATININE 3.4* 3.3*  CALCIUM 10.1 10.3   Liver Function Tests:  Recent Labs  01/28/14 1147 04/22/14 1347  AST 27 25  ALT 24 27  ALKPHOS 139 135  BILITOT 0.30 0.43  PROT 6.7 6.7  ALBUMIN 3.5 3.3*  No results for input(s): LIPASE, AMYLASE in the last 8760 hours. No results for input(s): AMMONIA in the last 8760 hours. CBC:  Recent Labs  04/22/14 1346 06/17/14 1226 07/15/14 1121  WBC 6.7 6.8 7.1  NEUTROABS 4.9 4.8 5.2  HGB 9.6* 11.0* 10.3*  HCT 29.6* 34.2* 31.9*  MCV 102.7* 101.6* 103.0*  PLT 219 198 192   10/02/14 na 137, cl 102, bun 52, cr 1.96, glu 84 09/30/14 wbc 7.41, hb 10.3, hct 31.7, mcv 97.8, plt 217 09/26/14 na 132, k 4.2, bun 43, cr 1.9, glu 133 09/20/14 tsh 4.78, ldl 41, hdl 38, t.chol 100, tg 106  Assessment/Plan  Physical deconditioning Will have patient work with PT/OT as tolerated to regain strength and restore function.  Fall precautions are in place.  Left humeral fracture S/p sling in place, change tylenol to tylenol extra strength 1000 mg bid for now and reassess  Left hip fracture NWB for now, continue heparin for dvt prophylaxis, will need to f/u with orthopedics in 1-2 weeks, pain management and to work with therapy team here, fall precautions  Seizure Remains seizure free, continue keppra 500 mg bid for now  Insomnia Continue melatonin for now  Failure to thrive Poor po intake, decline post fall and fracture, monitor clinically,encourage po intake, continue remeron to help with mood and appetite. continue skin care, pressure ulcer prophylaxis, fall precautions, will need assistance with ADLs. Will see how she works with therapy team, if no improvement noted, will need to  consider palliative care consult  Dysphagia Her thrush could be causing some odynophagia but will have a formal SLP consult to help rule out dysphagia. On pureed diet at present. Post SLP evaluation and change of diet, if no improvement, might need workup for esophagitis.  Oral thrush Continue mycostatin mouth wash, oral hygiene  gerd Continue omeprazole and monitor clinically  C.diff colitis Has loose stools, continue po vancomycin tid and complete 2 weeks course, contact precautions, hydration encouraged  Hypothyroidism Continue levothyroxine 25 mcg daily   Goals of care: short term rehabilitation, possibel long term care requirement    Labs/tests ordered: cbc with diff, cmp, prelabumin    Blanchie Serve, MD  [facility] [phone] (Monday-Friday 8 am - 5 pm) [phone] (afterhours)

## 2014-10-14 ENCOUNTER — Encounter: Payer: Self-pay | Admitting: Adult Health

## 2014-10-14 ENCOUNTER — Non-Acute Institutional Stay (SKILLED_NURSING_FACILITY): Payer: Medicare PPO | Admitting: Adult Health

## 2014-10-14 DIAGNOSIS — G4700 Insomnia, unspecified: Secondary | ICD-10-CM

## 2014-10-14 DIAGNOSIS — E875 Hyperkalemia: Secondary | ICD-10-CM

## 2014-10-14 DIAGNOSIS — E871 Hypo-osmolality and hyponatremia: Secondary | ICD-10-CM

## 2014-10-14 NOTE — Progress Notes (Signed)
Patient ID: [name], female   DOB: 05/17/1931, 81 y.o.   MRN: [id number]   10/14/2014  Facility:  Nursing Home Location:  [facility] Room Number: 702-P LEVEL OF CARE:  SNF (31)   Chief Complaint  Patient presents with  . Acute Visit    Insomnia, Hyponatremia and Hyperkalemia    HISTORY OF PRESENT ILLNESS:  This is an 81 year old female who complains of insomnia. She was recently treated with IV fluids of D5.9 NS due to hyponatremia and Kayexalate due to hyperkalemia. Patient is currently having a short-term rehabilitation here @ [facility]. She fell after a seizure resulting in left humeral fracture and left hip fracture. Conservative management was decided upon and will have sling on the left arm and  pain management.  PAST MEDICAL HISTORY:  Past Medical History  Diagnosis Date  . Gout   . Fatigue   . Hypertension   . Chronic kidney disease     stage 4 renal disease-on transplant list  . S/P radiation therapy 04/10/12 - 05/05/12    Right Breast/40.05Gy/15 Fractions with Boost of 10gy/5 Fractions  . Atrophic vaginitis 3/71991  . Fibroadenoma 02/17/1989    right-stable   . Ovarian cancer 10/2002  . Breast cancer 2013    invasive breast cancer  . Anemia     CURRENT MEDICATIONS: Reviewed per MAR/see medication list  Allergies  Allergen Reactions  . Nsaids     Pt. Does not want this type drug due to impaired renal function  . Renal [Actical]     DOES NOT WANT ANY MEDS THAT EFFECT RENAL FUNTION     REVIEW OF SYSTEMS:  GENERAL: no fatigue, no weight changes, no fever, chills or weakness RESPIRATORY: no cough, SOB, DOE, wheezing, hemoptysis CARDIAC: no chest pain, edema or palpitations GI: no abdominal pain, diarrhea, constipation, heart burn, nausea or vomiting  PHYSICAL EXAMINATION  GENERAL: no acute distress, normal body habitus EYES: conjunctivae normal, sclerae normal, normal eye lids NECK: supple, trachea midline, no neck masses,  no thyroid tenderness, no thyromegaly LYMPHATICS: no LAN in the neck, no supraclavicular LAN RESPIRATORY: breathing is even & unlabored, BS CTAB CARDIAC: RRR, no murmur,no extra heart sounds, no edema GI: abdomen soft, normal BS, no masses, no tenderness, no hepatomegaly, no splenomegaly EXTREMITIES: Able to move all 4 extremities PSYCHIATRIC: the patient is alert & oriented to person, affect & behavior appropriate  LABS/RADIOLOGY: 10/10/14  sodium 135 potassium 4.9 glucose 116 BUN 45 creatinine 2.1 calcium 21.8 GFR 24.66 10/08/14  sodium 129 potassium 5.9 glucose 100 BUN 47 creatinine 2.4 calcium 10.5 GFR 21.07 WBC 6.8 hemoglobin 11.1 hematocrit 35.2 MCV 100.33 prealbumin 31 Basic Metabolic Panel:  Recent Labs  01/28/14 1147 04/22/14 1347  NA 141 139  K 5.0 4.9  CO2 21* 22  GLUCOSE 90 93  BUN 79.0* 76.3*  CREATININE 3.4* 3.3*  CALCIUM 10.1 10.3   Liver Function Tests:  Recent Labs  01/28/14 1147 04/22/14 1347  AST 27 25  ALT 24 27  ALKPHOS 139 135  BILITOT 0.30 0.43  PROT 6.7 6.7  ALBUMIN 3.5 3.3*   No results for input(s): LIPASE, AMYLASE in the last 8760 hours. No results for input(s): AMMONIA in the last 8760 hours. CBC:  Recent Labs  04/22/14 1346 06/17/14 1226 07/15/14 1121  WBC 6.7 6.8 7.1  NEUTROABS 4.9 4.8 5.2  HGB 9.6* 11.0* 10.3*  HCT 29.6* 34.2* 31.9*  MCV 102.7* 101.6* 103.0*  PLT 219 198 192  10/02/14 na 137, cl 102, bun 52, cr 1.96, glu 84 09/30/14 wbc 7.41, hb 10.3, hct 31.7, mcv 97.8, plt 217 09/26/14 na 132, k 4.2, bun 43, cr 1.9, glu 133 09/20/14 tsh 4.78, ldl 41, hdl 38, t.chol 100, tg 106   ASSESSMENT/PLAN:  Insomnia - Ambien 2.5 mg 1 by mouth daily at bedtime  Hyperkalemia - check BMP Hyponatremia - follow-up BMP     [place], NP [place]

## 2014-10-22 ENCOUNTER — Other Ambulatory Visit: Payer: Self-pay | Admitting: Orthopedic Surgery

## 2014-10-22 DIAGNOSIS — S42302A Unspecified fracture of shaft of humerus, left arm, initial encounter for closed fracture: Secondary | ICD-10-CM

## 2014-10-22 DIAGNOSIS — S72002A Fracture of unspecified part of neck of left femur, initial encounter for closed fracture: Secondary | ICD-10-CM

## 2014-10-23 ENCOUNTER — Ambulatory Visit
Admission: RE | Admit: 2014-10-23 | Discharge: 2014-10-23 | Disposition: A | Discharge status: Home / Self Care | Payer: Medicare PPO | Source: Ambulatory Visit | Attending: Orthopedic Surgery | Admitting: Orthopedic Surgery

## 2014-10-23 DIAGNOSIS — S72002A Fracture of unspecified part of neck of left femur, initial encounter for closed fracture: Secondary | ICD-10-CM

## 2014-10-23 DIAGNOSIS — S42302A Unspecified fracture of shaft of humerus, left arm, initial encounter for closed fracture: Secondary | ICD-10-CM

## 2014-10-24 ENCOUNTER — Ambulatory Visit (INDEPENDENT_AMBULATORY_CARE_PROVIDER_SITE_OTHER): Payer: Medicare PPO | Admitting: Neurology

## 2014-10-24 ENCOUNTER — Encounter: Payer: Self-pay | Admitting: Neurology

## 2014-10-24 VITALS — BP 134/87 | HR 109 | Resp 14

## 2014-10-24 DIAGNOSIS — R569 Unspecified convulsions: Secondary | ICD-10-CM

## 2014-10-24 DIAGNOSIS — C719 Malignant neoplasm of brain, unspecified: Secondary | ICD-10-CM

## 2014-10-24 NOTE — Patient Instructions (Signed)
As far as your medications are concerned, I would like to suggest: continue current medications  As far as diagnostic testing: EEG and MRI of the brain  I would like to see you back in 3 months, sooner if we need to. Please call us with any interim questions, concerns, problems, updates or refill requests.   Please also call us for any test results so we can go over those with you on the phone.  My clinical assistant and will answer any of your questions and relay your messages to me and also relay most of my messages to you.   Our phone number is [phone]. We also have an after hours call service for urgent matters and there is a physician on-call for urgent questions. For any emergencies you know to call 911 or go to the nearest emergency room

## 2014-10-24 NOTE — Procedures (Signed)
     History: [name] is an 81 year old patient with a history of breast cancer and ovarian cancer. The patient has had a witnessed seizure event that occurred in November 2015. The patient has had issues with hyponatremia with a sodium of 116 in December 2015. The patient is being evaluated for the seizures.  This is a routine EEG. No skull defects are noted. Medications include Chronulac, Keppra, Remeron, multivitamins, Prilosec, Synthroid, and melatonin.  EEG classification: Dysrhythmia grade 1 generalized, maximal left hemisphere  Description of the recording: The background rhythms of this recording consists of a moderately well modulated medium amplitude alpha rhythm of 8 Hz that is reactive to eye opening and closure. As the record progresses, there appears to be an excessive amount of head movement artifact throughout the recording, with electrode artifact emanating from the T5 electrode throughout the recording. Photic stimulation and hyperventilation were not performed. There appears be some persistence of higher amplitudes of the recording and some overlying 5-6 Hz theta slowing that is more prominent in the left hemisphere than the right. Slowing appears to be more prominent frontally. At no time during the recording does there appear to be evidence of spike or spike-wave discharges. EKG monitor shows no evidence of cardiac rhythm abnormalities with a heart rate of 108.  Impression: This is an abnormal EEG recording secondary to generalized theta slowing intermittently, with some predominance in the left hemisphere. This study suggests some left brain pathology, but the study was technically difficult secondary to significant electrode and movement artifact. No epileptiform discharges were seen.

## 2014-10-24 NOTE — Progress Notes (Addendum)
[facility]    Provider:  Dr Jaynee Eagles Referring Provider: Geoffery Lyons, MD Primary Care Physician:  Geoffery Lyons, MD  CC:  Seizure  HPI:  [name] is a 81 y.o. female here as a referral from Dr. Reynaldo Minium for Seizures. She has a PMHx of breast cancer 2 years ago s/p lumpectomy and radiation as well as ovarian cancer 12 years ago s/p surgical debunking and chemo in remission along with stage 4 CKD, adult failure to thrive, dysphagia and HTN. She is accompanied by her husband and EMS services in a stretcher. She is lethargic and husband provides all the information.  She does not have a history of seizures. They were vacationing in [place] in November. They were dining in a restaurant when patient fell off the chair and into a window and broke her left arm and hip.  He looked at her, her eyes were wide open, she looked stiff, doesn't remember if she was having clonic movements. She was foaming at the mouth, eyes open and was incontinent of urine. Seizure lasted Lasted 10-15 minutes. In the ambulance she started responding but was confused and tired. No recent illnesses or inciting factors. MRI at [facility] in [place] showed remote lacunar CVAs as well as a new frontal lobe lesion . EEG was performed but I don't have the results. No seizures since then. No personal or family history of seizures. No side effects from the [place] that she was started on in the hospital. She fractured her left humerus and pelvis. CT scan in the hospital showed old lacunar infarcts in the right internal capsule and left thalamus as well as larger subcortical lesion in the left anterior frontal lobe.   Patient brings the MRI disk with him today and images personally reviewed. My findings: There is gyral swelling and enlargement of the left frontal lobe with associated T2 / T2FLAIR hyperintense signal. No contrast was given so unable to determine if this enhances. May represent peri-ictal  seizure phenomenon, subacute stroke. Low grade glioma / neoplasm or other autoimmune/inflammatory etiology are also considerations.   Reviewed notes, labs and imaging from outside physicians, which showed: Records from [facility] in [place], [employer]. Per notes, MRI showed chronic ischemic disease and chronic frontal lobe infarct and CT scan in the hospital per notes showed old lacunar infarcts in the right internal capsule and left thalamus as well as larger subcortical infarct in the left anterior frontal lobe. Patient was sent to SNF following admission in November but was readmitted in December for due to hyponatremia and hyoerkalemia. Na was 116 and K 6.4. K improved with insulin, cagluconate and kayexelate. Etiology thought to be poor PO intake and poor water intake. She was treated and developed Cdiff s/p 14 days of cdiff treatment. Anorexia has been a problem since the fall, remeron was started, and notes state that she does not have dysphagia but just prefers soft food fiet. ASA was on hold and statin was stopped due to her panel significantly below goal.    Recent labs 10/22/2014: BMP with BUN 53 and creatinine 2.4, GFR 20.96. Cbc with anemia (hgb 11.1),   Review of Systems: Patient complains of symptoms per HPI as well as the following symptoms: easy bruising, weight loss, fatigue, cough, feeling hot, feeling cold, itching, memory loss, confusion, weakness, slurred speech, difficulty swallowing, depression, decreased energy, change in appetite, disinterest in activities. . Pertinent negatives per HPI. All others negative.   History   Social History  .  Marital Status: Married    Spouse Name: N/A    Number of Children: N/A  . Years of Education: N/A   Occupational History  . Not on file.   Social History Main Topics  . Smoking status: Former Smoker -- 0.50 packs/day    Quit date: 03/01/1963  . Smokeless tobacco: Never Used  . Alcohol Use: No  . Drug Use: No  . Sexual  Activity: No     Comment: hysterectomy   Other Topics Concern  . Not on file   Social History Narrative   Patient is staying in a nursing home.     Family History  Problem Relation Age of Onset  . Ovarian cancer Sister     Past Medical History  Diagnosis Date  . Gout   . Fatigue   . Hypertension   . Chronic kidney disease     stage 4 renal disease-on transplant list  . S/P radiation therapy 04/10/12 - 05/05/12    Right Breast/40.05Gy/15 Fractions with Boost of 10gy/5 Fractions  . Atrophic vaginitis 3/71991  . Fibroadenoma 02/17/1989    right-stable   . Ovarian cancer 10/2002  . Breast cancer 2013    invasive breast cancer  . Anemia     Past Surgical History  Procedure Laterality Date  . Appendectomy    . Total abdominal hysterectomy w/ bilateral salpingoophorectomy  1/04    Stage IC ovarian cancer  . Shoulder arthroscopy  2010    rt  . Eye surgery      both cataracts  . Breast biopsy  02/08/12    Right Breast - Invasive Carcinoma, ER 0%, PR 0%, Ki6 75%  . Mastectomy partial / lumpectomy w/ axillary lymphadenectomy  03/08/12    Right Breast- Invasive Ductal: 0/1 Node Negative    Current Outpatient Prescriptions  Medication Sig Dispense Refill  . acetaminophen (TYLENOL) 325 MG tablet Take 650 mg by mouth every 4 (four) hours as needed.    . heparin 10000 UNIT/ML injection Inject 5,000 Units into the skin 3 (three) times daily.    . heparin 500 Units in sodium chloride irrigation 0.9 % 500 mL Irrigate with as directed once.    . lactulose (CHRONULAC) 10 GM/15ML solution Take by mouth daily as needed for mild constipation.    . levETIRAcetam (KEPPRA) 500 MG tablet Take 500 mg by mouth 2 (two) times daily.    . Melatonin 3 MG CAPS Take 1 capsule by mouth daily.    . mirtazapine (REMERON) 7.5 MG tablet Take 7.5 mg by mouth at bedtime.    . Multiple Vitamins-Minerals (MULTIVITAMIN PO) Take by mouth daily.    Marland Kitchen nystatin (MYCOSTATIN) 100000 UNIT/ML suspension Take 5 mLs by  mouth 4 (four) times daily.    Marland Kitchen omeprazole (PRILOSEC) 20 MG capsule Take 20 mg by mouth daily.    Marland Kitchen saccharomyces boulardii (FLORASTOR) 250 MG capsule Take 250 mg by mouth 2 (two) times daily.    Marland Kitchen SYNTHROID 25 MCG tablet Take 25 mcg by mouth daily.     . vancomycin (VANCOCIN) 125 MG capsule Take 125 mg by mouth 3 (three) times daily.     No current facility-administered medications for this visit.    Allergies as of 10/24/2014 - Review Complete 10/24/2014  Allergen Reaction Noted  . Nsaids  03/01/2012  . Renal [actical]  02/29/2012    Vitals: BP 134/87 mmHg  Pulse 109  Ht   Wt   LMP 10/04/2002 Last Weight:  Wt Readings from Last 1  Encounters:  10/14/14 119 lb 9.6 oz (54.25 kg)   Last Height:   Ht Readings from Last 1 Encounters:  10/14/14 5\' 9"  (1.753 m)   Cannot weigh patient as she is stretcher bound and we do not know the weight of the stretcher and cannot take patient out of the stretcher due to broken pelvis.  Physical exam: Exam: Gen: She appears to be in significant pain, frail                    CV: RRR, no MRG. No Carotid Bruits.  Eyes: Conjunctivae clear without exudates or hemorrhage  Neuro: Detailed Neurologic Exam  Speech:    No aphasia.  Cognition:     The patient is oriented to person and place;     Unable to test recent and remote memory or fund of knowledge due to medical condition;  Impaired attention, concentration due to pain/discomfort on stretcher Cranial Nerves:    The pupils are equal, round, and reactive to light. Cannot visualize fundi due to small pupils. Visual fields appear full as she blinks to threat in all quadrants. Extraocular movements appear intact. Trigeminal sensation is intact and the muscles of mastication are normal. The face is symmetric. The palate elevates in the midline. Hearing appears intact. Voice is normal. Shoulder shrug appears normal. The tongue has normal motion without fasciculations.   Coordination:   Cannot test  due to medical condition  Gait:    Cannot test, patient is non mobile and in a stretcher due to medical condition  Motor Observation:    no involuntary movements noted. Tone:    Normal muscle tone.    Posture:    Unable to test due to medical condition    Strength:    unable to test due to medical condition     Sensation: intact to LT     Reflex Exam:  DTR's:     Attempted but unable to test due to medical condition Toes:    Attempted, patient reported that it was painful and asked me to stop. Unable to complete.    Clonus:    Clonus is absent.      Assessment/Plan:  81 y.o. female here as a referral from Dr. Reynaldo Minium for new-onset Seizures. She has a PMHx of breast cancer 2 years ago s/p lumpectomy and radiation as well as ovarian cancer 12 years ago s/p surgical debulking and chemo in remission along with stage 4 CKD, adult failure to thrive, dysphagia and HTN. She is accompanied by her husband and EMS services in a stretcher. She is lethargic and husband provides all the information.  They were dining in a restaurant when patient fell off the chair and into a window and  fractured her left humerus and pelvis.Marland Kitchen Described as tonic-clonic movements, eyes open, frothing at the mouth, urinary incontinence, episode lasting for 15 minutes with post-ictal confusion. She was started on keppra. No seizures since this episode.   Patient brings the MRI disk with him today and images personally reviewed. My findings: There is gyral swelling and enlargement of the left frontal lobe with associated T2 / T2FLAIR hyperintense signal. No contrast was given so unable to determine if this enhances. May represent peri-ictal seizure phenomenon, subacute stroke. Low grade glioma / neoplasm or other autoimmune/inflammatory etiology are also considerations. Imaging also showed old lacunar infarcts in the right internal capsule and left thalamus, chronic white-matter changes.  - Patient placed on Keppra  500mg  bid. No repeat seizures. Will  continue on this dose - Needs daily ASA for stroke prevention and close follow up with primary care for aggressive management of vascular risk factors such as HTN, glucose and cholesterol. Statin was discontinued in the hospital due to very low LDL per notes, need records from [facility] in [place] - Need repeat MRi of the brain to ascertain the etiology of the left frontal lesion. Contrast administration would be exceedingly helpful however patient has renal disease, will order and see if radiology feels we can safely administer. GFR was 20 on last labs, likely will not be able to give contrast. Will order with contrast and see if renal funtion can be repeated before MRI and possibly renal function that day is better.  - Spoke to daughter today, she is in [place]. Will email information to [email or web address] -EEG in the office today  Sarina Ill, MD  [facility] [address] [place], [place] [address]  Phone [phone] Fax [phone]  A total of 80 minutes was spent in with this patient. Over half this time was spent on counseling patient on the seizure diagnosis and different diagnostic and therapeutic options available.

## 2014-10-28 ENCOUNTER — Telehealth: Payer: Self-pay

## 2014-10-28 ENCOUNTER — Telehealth: Payer: Self-pay | Admitting: Neurology

## 2014-10-28 ENCOUNTER — Encounter: Payer: Self-pay | Admitting: Neurology

## 2014-10-28 NOTE — Telephone Encounter (Signed)
Left message for patient's husband relaying the EEG results, generalized slowing with predominance in the left hemisphere likely corresponding to the left frontal lesion, no epileptiform discharges seen.

## 2014-10-28 NOTE — Telephone Encounter (Signed)
Called back. I need the best fax number. Was routed a voice mail. Was given [phone] by the receptionist. Left another voice mail that I will fax note to this number.   Colletta [place] - I will give you my note to fax over please. Thank you.

## 2014-10-28 NOTE — Telephone Encounter (Signed)
Janett Billow calling from [facility] wanting results NCV/EMG [id number]

## 2014-10-29 NOTE — Telephone Encounter (Signed)
Done

## 2014-10-30 ENCOUNTER — Non-Acute Institutional Stay (SKILLED_NURSING_FACILITY): Payer: Medicare PPO | Admitting: Internal Medicine

## 2014-10-30 DIAGNOSIS — G9340 Encephalopathy, unspecified: Secondary | ICD-10-CM

## 2014-10-30 DIAGNOSIS — K5900 Constipation, unspecified: Secondary | ICD-10-CM

## 2014-10-30 DIAGNOSIS — R569 Unspecified convulsions: Secondary | ICD-10-CM

## 2014-10-30 DIAGNOSIS — R5381 Other malaise: Secondary | ICD-10-CM

## 2014-10-30 NOTE — Progress Notes (Signed)
Patient ID: [name], female   DOB: 09/23/1931, 81 y.o.   MRN: [id number]    [facility]  Chief Complaint  Patient presents with  . Acute Visit    excessive sleeping, surgical concerns, poor po intake, constipation, insurance concerns   Allergies  Allergen Reactions  . Nsaids     Pt. Does not want this type drug due to impaired renal function  . Renal [Actical]     DOES NOT WANT ANY MEDS THAT EFFECT RENAL FUNTION   HPI 81 y/o female patient is here for rehabilitation post fall from seizure and left hip and humerus fracture. She is currently under conservative management with tylenol for pain. She has been sleeping a lot more as per husband who is present in the room. She was getting PT until seen by orthopedics on 10/22/14 where she was put on bed rest and ct scan of her hip and humerus left side were ordered. She is to follow with orthopedics on Friday 11/01/14. She has also been see by neurology, keppra was continued, EEG showed concerns for left brain pathology and she is pending mri brain on 11/04/14.  Husband is concerned with her losing weight, having poor po intake and being less interactive.  Patient complaints of being extremely tired and being constipated. She is passing gas but has not had a bowel movement for few days.  ROS Limited with patient's cognition.  Denies fever or chills Denies nausea or vomiting or abdominal pain Denies chest pain and dyspnea. Denies palpitations Her left shoulder and hip pain bothers her but family does not want any other pain medication besides tylenol No falls in the facility, currently on bed rest  Past Medical History  Diagnosis Date  . Gout   . Fatigue   . Hypertension   . Chronic kidney disease     stage 4 renal disease-on transplant list  . S/P radiation therapy 04/10/12 - 05/05/12    Right Breast/40.05Gy/15 Fractions with Boost of 10gy/5 Fractions  . Atrophic vaginitis 3/71991  . Fibroadenoma  02/17/1989    right-stable   . Ovarian cancer 10/2002  . Breast cancer 2013    invasive breast cancer  . Anemia      Medication List       This list is accurate as of: 10/30/14  5:45 PM.  Always use your most recent med list.               acetaminophen 500 MG tablet  Commonly known as:  TYLENOL  Take 500 mg by mouth 2 (two) times daily.     FLORASTOR 250 MG capsule  Generic drug:  saccharomyces boulardii  Take 250 mg by mouth 2 (two) times daily.     heparin 10000 UNIT/ML injection  Inject 5,000 Units into the skin 3 (three) times daily.     iron polysaccharides 150 MG capsule  Commonly known as:  NIFEREX  Take 150 mg by mouth daily.     lactulose 10 GM/15ML solution  Commonly known as:  CHRONULAC  Take 20 g by mouth daily as needed for mild constipation.     levETIRAcetam 500 MG tablet  Commonly known as:  KEPPRA  Take 500 mg by mouth 2 (two) times daily.     Melatonin 3 MG Caps  Take 1 capsule by mouth daily.     mirtazapine 7.5 MG tablet  Commonly known as:  REMERON  Take 15 mg by mouth at bedtime.  MULTIVITAMIN PO  Take by mouth daily.     nystatin 100000 UNIT/ML suspension  Commonly known as:  MYCOSTATIN  Take 5 mLs by mouth 4 (four) times daily.     omeprazole 20 MG capsule  Commonly known as:  PRILOSEC  Take 20 mg by mouth daily.     SYNTHROID 25 MCG tablet  Generic drug:  levothyroxine  Take 25 mcg by mouth daily.     zolpidem 5 MG tablet  Commonly known as:  AMBIEN  Take 2.5 mg by mouth at bedtime as needed for sleep.       Physical exam BP 121/76 mmHg  Pulse 68  Temp(Src) 97.9 F (36.6 C)  Resp 18  SpO2 96%  LMP 10/04/2002  General- elderly female in no acute distress, frail and ill appearing Head- atraumatic, normocephalic Eyes- no pallor, no icterus, no discharge Neck- no cervical lymphadenopathy Throat- moist mucus membrane Cardiovascular- normal s1,s2, no murmurs, palpable dorsalis pedis and radial pulse Respiratory-  bilateral clear to auscultation, no wheeze, no rhonchi, no crackles, no use of accessory muscles Abdomen- bowel sounds present, soft, non tender Musculoskeletal- left arm in sling, able to move her fingers, good radial pulse, left hip ROM limited with pain, no leg edema   Neurological- sleepy during conversation Skin- warm and dry Psychiatry- calm and can carry conversation to some extent  Imaging  10/24/14  EEG  Impression: This is an abnormal EEG recording secondary to generalized theta slowing intermittently, with some predominance in the left hemisphere. This study suggests some left brain pathology, but the study was technically difficult secondary to significant electrode and movement artifact. No epileptiform discharges were seen.    10/23/14 ct left hip  IMPRESSION: 1. Acute/subacute traumatic fracture of the left acetabulum with significant acetabular protrusio. 2. Impaction fractures of the left femoral head anteriorly and superiorly. 3. No evidence of osseous metastatic disease or pathologic fracture.   10/23/14 ct left humerus IMPRESSION: 1. Comminuted subacute appearing fracture of the left humeral head and neck as described. Early nonunion of this fracture cannot be excluded. 2. Significant rotator cuff muscular atrophy. The infraspinous tendon likely inserts on a posteriorly displaced fracture fragment. 3. Small left thyroid nodule, unlikely to be clinically significant.  Labs 10/28/14 wbc 8.2, hb 11.6, hct 35.9, plt 240, tsh 3.51 10/22/14 na 134, k 5.1, glu 117, bun 53, cr 2.4, prealbumin 31  Assessment/plan  Physical deconditioning Her chronic illness, recent fall with fracture and seizure are all contributing to this. She is on bed rest at present with assessment for possible surgical repair for her fracture and pt continues to decline. She has failure to thrive and decline anticipated. Monitor clinically. Encourage po intake. Continue protein shakes. Await  orthopedic clearance to continue with PT and OT actively. I don't feel pt is clinically stable to be discharged home at present, I would await ortho f/u to see if she would undergo surgical repair. If not a surgical candidate, will need to continue therapy but if she shows no improvement with therapy, will need to have family meeting to review goals of care.  Acute encephalopathy Has renal impairment but electrolytes otherwise look fine. Normal white count, liver function on lab review. Will discontinue her remeron, ambien and melatonin. Avoid hypnotics/ sedatives where possible. eeg was suggestive of ongoing left brain pathology which could be contributing to her somnolence, impaired cognition. Pending MRI and neurology follow up  Constipation Her limited mobility, hypothyroidism and being on iron supplement could all be contributing  to this. Pt is on prn lactulose. Add miralax 17 g bid for now x 3 days and then once a day and continue prn lactulose. D/c florastor  Seizure No recent seizure noted on EEG. Continue keppra 500 mg bid for now   Reviewed care plan with patient, her husband and nursing supervisor in detail.

## 2014-11-04 ENCOUNTER — Ambulatory Visit
Admission: RE | Admit: 2014-11-04 | Discharge: 2014-11-04 | Disposition: A | Discharge status: Home / Self Care | Payer: Medicare PPO | Source: Ambulatory Visit | Attending: Neurology | Admitting: Neurology

## 2014-11-04 ENCOUNTER — Other Ambulatory Visit: Payer: Medicare PPO

## 2014-11-04 ENCOUNTER — Other Ambulatory Visit: Payer: Self-pay | Admitting: Neurology

## 2014-11-04 ENCOUNTER — Telehealth: Payer: Self-pay | Admitting: Neurology

## 2014-11-04 DIAGNOSIS — C719 Malignant neoplasm of brain, unspecified: Secondary | ICD-10-CM

## 2014-11-04 NOTE — Telephone Encounter (Signed)
Thank you, I did reorder with sedation. Terrence Dupont, would you please call [name]? Thank you.

## 2014-11-04 NOTE — Telephone Encounter (Signed)
I reordered with sedation. Terrence Dupont, Would you speak to [facility] and see if we need to do anything to get this completed? And please call the patient's husband back and let him know we need sedation.  Thank you.

## 2014-11-04 NOTE — Telephone Encounter (Signed)
[name] is calling to state that patient was not calm enough for MRI.  Calling to see if Dr. Jaynee Eagles still wants patient to have MRI and if so would you prescribe sedation.  Please call.

## 2014-11-04 NOTE — Telephone Encounter (Signed)
Pt was brought to them by ambulance today for MRI.  The MRI was not completed, the pt was talking through the whole thing and refuse to continue.  If you order another MRI the patient should be sedated.  If you have any questions please call.

## 2014-11-05 NOTE — Telephone Encounter (Signed)
Would you call husband and discuss with him please? Ask if he wants to try with xanax or if he would prefer conscious sedation. Thank you!

## 2014-11-05 NOTE — Telephone Encounter (Signed)
Tried [employer]. Got a busy signal when I called. Will try again later.

## 2014-11-05 NOTE — Telephone Encounter (Signed)
Talked with husband about which they would prefer for the MRI. They would like to try the xanax as a sedative. Does not want to do the conscious sedation. Also, husband wanted to make sure no contrast dye would be used for the MRI due to her kidney function.

## 2014-11-18 ENCOUNTER — Other Ambulatory Visit: Payer: Self-pay | Admitting: *Deleted

## 2014-11-18 MED ORDER — LORAZEPAM 0.5 MG PO TABS: ORAL_TABLET | ORAL | Status: AC

## 2014-11-18 NOTE — Telephone Encounter (Signed)
[facility] 

## 2014-12-02 ENCOUNTER — Ambulatory Visit: Payer: Medicare PPO

## 2014-12-02 ENCOUNTER — Other Ambulatory Visit: Payer: Medicare PPO

## 2014-12-03 ENCOUNTER — Encounter (HOSPITAL_COMMUNITY): Admission: RE | Payer: Self-pay | Source: Ambulatory Visit

## 2014-12-03 ENCOUNTER — Ambulatory Visit (HOSPITAL_COMMUNITY): Admission: RE | Admit: 2014-12-03 | Payer: Medicare PPO | Source: Ambulatory Visit | Admitting: Neurology

## 2014-12-03 ENCOUNTER — Ambulatory Visit (HOSPITAL_COMMUNITY): Admission: RE | Admit: 2014-12-03 | Payer: Medicare PPO | Source: Ambulatory Visit

## 2014-12-03 SURGERY — RADIOLOGY WITH ANESTHESIA
Anesthesia: General | Site: Head | Laterality: Left

## 2014-12-03 DEATH — deceased

## 2015-01-27 ENCOUNTER — Other Ambulatory Visit: Payer: Medicare PPO

## 2015-01-27 ENCOUNTER — Ambulatory Visit: Payer: Medicare PPO

## 2015-02-18 ENCOUNTER — Other Ambulatory Visit: Payer: Self-pay | Admitting: Oncology

## 2015-03-24 ENCOUNTER — Ambulatory Visit: Payer: Medicare PPO

## 2015-03-24 ENCOUNTER — Other Ambulatory Visit: Payer: Medicare PPO

## 2015-04-28 ENCOUNTER — Ambulatory Visit: Payer: Medicare PPO | Admitting: Oncology

## 2016-03-13 ENCOUNTER — Other Ambulatory Visit: Payer: Self-pay | Admitting: Nurse Practitioner

## 2016-10-14 NOTE — Telephone Encounter (Signed)
Error
